# Patient Record
Sex: Female | Born: 1990 | Race: White | Hispanic: No | Marital: Single | Smoking: Never smoker
Health system: Southern US, Community
[De-identification: ages and names within clinical notes are randomized; demographics above are authoritative.]

---

## 2015-03-11 ENCOUNTER — Inpatient Hospital Stay: Payer: Medicaid Other

## 2015-03-11 ENCOUNTER — Inpatient Hospital Stay
Admission: EM | Admit: 2015-03-11 | Discharge: 2015-03-16 | DRG: 417 | Disposition: A | Discharge status: Home / Self Care | Payer: Medicaid Other | Attending: Surgery | Admitting: Surgery

## 2015-03-11 ENCOUNTER — Encounter: Payer: Self-pay | Admitting: Emergency Medicine

## 2015-03-11 ENCOUNTER — Encounter
Admission: EM | Disposition: A | Discharge status: Home / Self Care | Payer: Self-pay | Source: Home / Self Care | Attending: Surgery

## 2015-03-11 ENCOUNTER — Inpatient Hospital Stay: Payer: Medicaid Other | Admitting: Certified Registered Nurse Anesthetist

## 2015-03-11 ENCOUNTER — Emergency Department: Payer: Medicaid Other

## 2015-03-11 DIAGNOSIS — K859 Acute pancreatitis without necrosis or infection, unspecified: Secondary | ICD-10-CM

## 2015-03-11 DIAGNOSIS — R1011 Right upper quadrant pain: Secondary | ICD-10-CM

## 2015-03-11 DIAGNOSIS — K8066 Calculus of gallbladder and bile duct with acute and chronic cholecystitis without obstruction: Secondary | ICD-10-CM | POA: Diagnosis present

## 2015-03-11 DIAGNOSIS — Y848 Other medical procedures as the cause of abnormal reaction of the patient, or of later complication, without mention of misadventure at the time of the procedure: Secondary | ICD-10-CM | POA: Diagnosis not present

## 2015-03-11 DIAGNOSIS — K8050 Calculus of bile duct without cholangitis or cholecystitis without obstruction: Secondary | ICD-10-CM | POA: Diagnosis not present

## 2015-03-11 DIAGNOSIS — K851 Biliary acute pancreatitis without necrosis or infection: Secondary | ICD-10-CM | POA: Diagnosis present

## 2015-03-11 DIAGNOSIS — K819 Cholecystitis, unspecified: Secondary | ICD-10-CM

## 2015-03-11 DIAGNOSIS — K8051 Calculus of bile duct without cholangitis or cholecystitis with obstruction: Secondary | ICD-10-CM | POA: Diagnosis not present

## 2015-03-11 DIAGNOSIS — K8042 Calculus of bile duct with acute cholecystitis without obstruction: Secondary | ICD-10-CM | POA: Diagnosis present

## 2015-03-11 HISTORY — PX: ERCP: SHX5425

## 2015-03-11 LAB — COMPREHENSIVE METABOLIC PANEL
ALT: 81 U/L — ABNORMAL HIGH (ref 14–54)
AST: 128 U/L — AB (ref 15–41)
Albumin: 4 g/dL (ref 3.5–5.0)
Alkaline Phosphatase: 65 U/L (ref 38–126)
Anion gap: 9 (ref 5–15)
BUN: 11 mg/dL (ref 6–20)
CALCIUM: 9.4 mg/dL (ref 8.9–10.3)
CHLORIDE: 105 mmol/L (ref 101–111)
CO2: 26 mmol/L (ref 22–32)
CREATININE: 0.86 mg/dL (ref 0.44–1.00)
GFR calc Af Amer: >60 mL/min (ref >60)
GLUCOSE: 116 mg/dL — AB (ref 65–99)
Potassium: 3.7 mmol/L (ref 3.5–5.1)
SODIUM: 140 mmol/L (ref 135–145)
TOTAL PROTEIN: 7.4 g/dL (ref 6.5–8.1)
Total Bilirubin: 0.6 mg/dL (ref 0.3–1.2)

## 2015-03-11 LAB — CBC WITH DIFFERENTIAL/PLATELET
BASOS ABS: 0 10*3/uL (ref 0–0.1)
BASOS PCT: 0 %
Eosinophils Absolute: 0.1 10*3/uL (ref 0–0.7)
Eosinophils Relative: 1 %
HCT: 41.9 % (ref 35.0–47.0)
Hemoglobin: 14.3 g/dL (ref 12.0–16.0)
LYMPHS PCT: 18 %
Lymphs Abs: 2 10*3/uL (ref 1.0–3.6)
MCH: 31.2 pg (ref 26.0–34.0)
MCHC: 34.1 g/dL (ref 32.0–36.0)
MCV: 91.4 fL (ref 80.0–100.0)
Monocytes Absolute: 0.6 10*3/uL (ref 0.2–0.9)
Monocytes Relative: 5 %
NEUTROS ABS: 8.7 10*3/uL — AB (ref 1.4–6.5)
Neutrophils Relative %: 76 %
Platelets: 239 10*3/uL (ref 150–440)
RBC: 4.59 MIL/uL (ref 3.80–5.20)
RDW: 12.5 % (ref 11.5–14.5)
WBC: 11.4 10*3/uL — ABNORMAL HIGH (ref 3.6–11.0)

## 2015-03-11 LAB — LIPASE, BLOOD: LIPASE: 57 U/L — AB (ref 22–51)

## 2015-03-11 SURGERY — ERCP, WITH INTERVENTION IF INDICATED
Anesthesia: General

## 2015-03-11 MED ORDER — ONDANSETRON HCL 4 MG/2ML IJ SOLN
4.0000 mg | Freq: Once | INTRAMUSCULAR | Status: AC
  Administered 2015-03-11: 4 mg via INTRAVENOUS

## 2015-03-11 MED ORDER — LIDOCAINE HCL (CARDIAC) 20 MG/ML IV SOLN
INTRAVENOUS | Status: DC | PRN
  Administered 2015-03-11: 50 mg via INTRAVENOUS

## 2015-03-11 MED ORDER — HEPARIN SODIUM (PORCINE) 5000 UNIT/ML IJ SOLN
INTRAMUSCULAR | Status: AC
  Administered 2015-03-11: 5000 [IU] via SUBCUTANEOUS
  Filled 2015-03-11: qty 1

## 2015-03-11 MED ORDER — HEPARIN SODIUM (PORCINE) 5000 UNIT/ML IJ SOLN
5000.0000 [IU] | Freq: Three times a day (TID) | INTRAMUSCULAR | Status: DC
Start: 2015-03-11 — End: 2015-03-11
  Administered 2015-03-11: 5000 [IU] via SUBCUTANEOUS

## 2015-03-11 MED ORDER — PROPOFOL 10 MG/ML IV BOLUS
INTRAVENOUS | Status: DC | PRN
  Administered 2015-03-11: 50 mg via INTRAVENOUS

## 2015-03-11 MED ORDER — SODIUM CHLORIDE 0.9 % IV SOLN
3.0000 g | Freq: Four times a day (QID) | INTRAVENOUS | Status: DC
  Administered 2015-03-11 – 2015-03-16 (×21): 3 g via INTRAVENOUS
  Filled 2015-03-11 (×27): qty 3

## 2015-03-11 MED ORDER — FENTANYL CITRATE (PF) 100 MCG/2ML IJ SOLN
INTRAMUSCULAR | Status: DC | PRN
  Administered 2015-03-11 (×2): 50 ug via INTRAVENOUS

## 2015-03-11 MED ORDER — FENTANYL CITRATE (PF) 100 MCG/2ML IJ SOLN: 25.0000 ug | INTRAMUSCULAR | Status: DC | PRN

## 2015-03-11 MED ORDER — ONDANSETRON HCL 4 MG/2ML IJ SOLN
INTRAMUSCULAR | Status: AC
  Administered 2015-03-11: 4 mg via INTRAVENOUS
  Filled 2015-03-11: qty 2

## 2015-03-11 MED ORDER — KCL IN DEXTROSE-NACL 20-5-0.45 MEQ/L-%-% IV SOLN
INTRAVENOUS | Status: DC
Start: 2015-03-11 — End: 2015-03-13
  Administered 2015-03-11 – 2015-03-12 (×4): via INTRAVENOUS
  Filled 2015-03-11 (×8): qty 1000

## 2015-03-11 MED ORDER — PROPOFOL INFUSION 10 MG/ML OPTIME
INTRAVENOUS | Status: DC | PRN
  Administered 2015-03-11: 100 ug/kg/min via INTRAVENOUS

## 2015-03-11 MED ORDER — SODIUM CHLORIDE 0.9 % IV BOLUS (SEPSIS)
1000.0000 mL | Freq: Once | INTRAVENOUS | Status: DC
  Administered 2015-03-11: 1000 mL via INTRAVENOUS

## 2015-03-11 MED ORDER — HYDROMORPHONE HCL 1 MG/ML IJ SOLN
1.0000 mg | INTRAMUSCULAR | Status: DC | PRN
  Administered 2015-03-11 – 2015-03-15 (×23): 1 mg via INTRAVENOUS
  Filled 2015-03-11 (×26): qty 1

## 2015-03-11 MED ORDER — SODIUM CHLORIDE 0.9 % IV SOLN
INTRAVENOUS | Status: DC
  Administered 2015-03-11: 16:00:00 via INTRAVENOUS
  Administered 2015-03-11: 100 mL via INTRAVENOUS

## 2015-03-11 MED ORDER — ONDANSETRON HCL 4 MG PO TABS: 4.0000 mg | ORAL_TABLET | Freq: Four times a day (QID) | ORAL | Status: DC | PRN

## 2015-03-11 MED ORDER — MIDAZOLAM HCL 5 MG/5ML IJ SOLN
INTRAMUSCULAR | Status: DC | PRN
  Administered 2015-03-11: 2 mg via INTRAVENOUS

## 2015-03-11 MED ORDER — ONDANSETRON HCL 4 MG/2ML IJ SOLN: 4.0000 mg | Freq: Once | INTRAMUSCULAR | Status: AC | PRN

## 2015-03-11 MED ORDER — INDOMETHACIN 50 MG RE SUPP
100.0000 mg | RECTAL | Status: AC
  Administered 2015-03-11: 100 mg via RECTAL
  Filled 2015-03-11 (×2): qty 2

## 2015-03-11 MED ORDER — ONDANSETRON HCL 4 MG/2ML IJ SOLN
4.0000 mg | Freq: Four times a day (QID) | INTRAMUSCULAR | Status: DC | PRN
  Administered 2015-03-12 – 2015-03-13 (×4): 4 mg via INTRAVENOUS
  Filled 2015-03-11 (×5): qty 2

## 2015-03-11 MED ORDER — KCL IN DEXTROSE-NACL 20-5-0.45 MEQ/L-%-% IV SOLN
INTRAVENOUS | Status: AC
  Filled 2015-03-11: qty 1000

## 2015-03-11 MED ORDER — SODIUM CHLORIDE 0.9 % IV BOLUS (SEPSIS)
1000.0000 mL | Freq: Once | INTRAVENOUS | Status: AC
  Administered 2015-03-11: 1000 mL via INTRAVENOUS

## 2015-03-11 MED ORDER — MORPHINE SULFATE 2 MG/ML IJ SOLN: 1.0000 mg | INTRAMUSCULAR | Status: DC | PRN

## 2015-03-11 MED ORDER — MORPHINE SULFATE 2 MG/ML IJ SOLN
INTRAMUSCULAR | Status: AC
  Administered 2015-03-11: 1 mg via INTRAVENOUS
  Filled 2015-03-11: qty 1

## 2015-03-11 MED ORDER — GLYCOPYRROLATE 0.2 MG/ML IJ SOLN
INTRAMUSCULAR | Status: DC | PRN
  Administered 2015-03-11: 0.2 mg via INTRAVENOUS

## 2015-03-11 MED ORDER — MORPHINE SULFATE 2 MG/ML IJ SOLN
2.0000 mg | Freq: Once | INTRAMUSCULAR | Status: AC
  Administered 2015-03-11: 1 mg via INTRAVENOUS

## 2015-03-11 NOTE — H&P (Signed)
Samantha Lawson is an 24 y.o. female.    Chief Complaint: Right upper quadrant pain  HPI: This a patient with multiple episodes of right upper quadrant pain she is approximately 3 months postpartum she's had multiple episodes over the last 2 weeks usually they are self limiting resolving in several hours. She's been due to the hospital in the past and obtained hydrocodone which seemed to help but tonight her pain was unrelenting. She's had no fevers or chills but has noticed slightly dark urine. Her pain is in the right upper quadrant and radiates through to her back. She's had nausea and vomiting in the past as well. Denies acholic stools.  History reviewed. No pertinent past medical history.  History reviewed. No pertinent past surgical history.  History reviewed. No pertinent family history. Social History:  reports that she has never smoked. She has never used smokeless tobacco. She reports that she drinks alcohol. She reports that she does not use illicit drugs.  Allergies: No Known Allergies   (Not in a hospital admission)   Review of Systems  Constitutional: Negative for fever and chills.  HENT: Negative.   Eyes: Negative.   Respiratory: Negative for cough, hemoptysis, shortness of breath and wheezing.   Cardiovascular: Negative for chest pain, palpitations and leg swelling.  Gastrointestinal: Positive for nausea, vomiting and abdominal pain. Negative for heartburn, diarrhea, constipation, blood in stool and melena.  Genitourinary: Negative.   Musculoskeletal: Negative.   Skin: Negative.   Neurological: Negative.   Endo/Heme/Allergies: Negative.   Psychiatric/Behavioral: Negative.      Physical Exam:  BP 123/69 mmHg  Pulse 76  Temp(Src) 98.5 F (36.9 C) (Oral)  Resp 16  Ht $R'5\' 2"'Qo$  (1.575 m)  Wt 150 lb (68.04 kg)  BMI 27.43 kg/m2  SpO2 100%  LMP 02/26/2015  Physical Exam  Constitutional: She is oriented to person, place, and time and well-developed, well-nourished,  and in no distress.  HENT:  Head: Normocephalic and atraumatic.  Mouth/Throat: No oropharyngeal exudate.  Eyes: No scleral icterus.  Neck: Normal range of motion. Neck supple.  Cardiovascular: Normal rate, regular rhythm and normal heart sounds.   Pulmonary/Chest: Effort normal and breath sounds normal. No respiratory distress. She has no wheezes. She has no rales.  Abdominal: Soft. She exhibits no distension. There is tenderness. There is no rebound and no guarding.  Questionable Murphy sign  Musculoskeletal: Normal range of motion.  Lymphadenopathy:    She has no cervical adenopathy.  Neurological: She is alert and oriented to person, place, and time.  Skin: Skin is warm and dry.  No jaundice  Psychiatric: Mood, memory and affect normal.        Results for orders placed or performed during the hospital encounter of 03/11/15 (from the past 48 hour(s))  CBC with Differential     Status: Abnormal   Collection Time: 03/11/15  3:47 AM  Result Value Ref Range   WBC 11.4 (H) 3.6 - 11.0 K/uL   RBC 4.59 3.80 - 5.20 MIL/uL   Hemoglobin 14.3 12.0 - 16.0 g/dL   HCT 41.9 35.0 - 47.0 %   MCV 91.4 80.0 - 100.0 fL   MCH 31.2 26.0 - 34.0 pg   MCHC 34.1 32.0 - 36.0 g/dL   RDW 12.5 11.5 - 14.5 %   Platelets 239 150 - 440 K/uL   Neutrophils Relative % 76 %   Neutro Abs 8.7 (H) 1.4 - 6.5 K/uL   Lymphocytes Relative 18 %   Lymphs Abs 2.0 1.0 -  3.6 K/uL   Monocytes Relative 5 %   Monocytes Absolute 0.6 0.2 - 0.9 K/uL   Eosinophils Relative 1 %   Eosinophils Absolute 0.1 0 - 0.7 K/uL   Basophils Relative 0 %   Basophils Absolute 0.0 0 - 0.1 K/uL  Comprehensive metabolic panel     Status: Abnormal   Collection Time: 03/11/15  3:47 AM  Result Value Ref Range   Sodium 140 135 - 145 mmol/L   Potassium 3.7 3.5 - 5.1 mmol/L   Chloride 105 101 - 111 mmol/L   CO2 26 22 - 32 mmol/L   Glucose, Bld 116 (H) 65 - 99 mg/dL   BUN 11 6 - 20 mg/dL   Creatinine, Ser 0.86 0.44 - 1.00 mg/dL   Calcium  9.4 8.9 - 10.3 mg/dL   Total Protein 7.4 6.5 - 8.1 g/dL   Albumin 4.0 3.5 - 5.0 g/dL   AST 128 (H) 15 - 41 U/L   ALT 81 (H) 14 - 54 U/L   Alkaline Phosphatase 65 38 - 126 U/L   Total Bilirubin 0.6 0.3 - 1.2 mg/dL   GFR calc non Af Amer >60 >60 mL/min   GFR calc Af Amer >60 >60 mL/min    Comment: (NOTE) The eGFR has been calculated using the CKD EPI equation. This calculation has not been validated in all clinical situations. eGFR's persistently <60 mL/min signify possible Chronic Kidney Disease.    Anion gap 9 5 - 15  Lipase, blood     Status: Abnormal   Collection Time: 03/11/15  3:47 AM  Result Value Ref Range   Lipase 57 (H) 22 - 51 U/L   US Abdomen Limited  03/11/2015   CLINICAL DATA:  Right upper quadrant pain for 2 hours. Similar symptoms intermittently for the past 3 months.  EXAM: US ABDOMEN LIMITED - RIGHT UPPER QUADRANT  COMPARISON:  None.  FINDINGS: Gallbladder:  Multiple calculi are present within the gallbladder lumen. There is mild gallbladder mural thickening, 3.8 mm. The patient was tender over the gallbladder.  Common bile duct:  Diameter: 7 mm, mildly dilated.  Liver:  No focal lesion identified. Within normal limits in parenchymal echogenicity.  IMPRESSION: Cholelithiasis. Mild gallbladder mural thickening. Tenderness over the gallbladder. The findings are concerning for acute cholecystitis. Mild dilatation of the extrahepatic bile ducts raises the question of choledocholithiasis.   Electronically Signed   By: Andreas Newport M.D.   On: 03/11/2015 03:19     Assessment/Plan  This is a patient with right upper quadrant pain with gallstones on ultrasound and suggestive of acute cholecystitis. She also has elevated liver function tests suggesting choledocholithiasis. My plan would be to admit her to the hospital hydrated control nausea and pain start anabiotic's and repeat liver function tests. At some point in the next 24 hours as liver function tests improve then  laparoscopic cholecystectomy could be performed. The rationale for this approach was discussed with she and her husband the options of observation were reviewed and she agrees with this plan. Discussed with Dr. Beather Arbour and nursing.  Florene Glen, MD, FACS

## 2015-03-11 NOTE — ED Provider Notes (Signed)
[facility] Emergency Department Provider Note  ____________________________________________  Time seen: Approximately 3:32 AM  I have reviewed the triage vital signs and the nursing notes.   HISTORY  Chief Complaint Abdominal Pain    HPI Samantha Lawson is a 24 y.o. female who presents with a 2 week history of RUQ pain. Patient is 3 months post-partum, not breastfeeding. She had a CT scan by her PCP last week and she was told she needed an [place]. Patient did not follow up with [place]. Patient reports 7/10 sharp, stabbing RUQ pain radiating into her back. Vomited once tonight secondary to pain. Denies fever, chills, diarrhea, headache, numbness, tingling. Nothing makes the pain better or worse.   History reviewed. No pertinent past medical history.  There are no active problems to display for this patient.   History reviewed. No pertinent past surgical history.  No current outpatient prescriptions on file.  Allergies Review of patient's allergies indicates no known allergies.  History reviewed. No pertinent family history.  Social History History  Substance Use Topics  . Smoking status: Never Smoker   . Smokeless tobacco: Never Used  . Alcohol Use: Yes    Review of Systems Constitutional: No fever/chills Eyes: No visual changes. ENT: No sore throat. Cardiovascular: Denies chest pain. Respiratory: Denies shortness of breath. Gastrointestinal: Positive for abdominal pain.  No nausea, no vomiting.  No diarrhea.  No constipation. Genitourinary: Negative for dysuria. Musculoskeletal: Negative for back pain. Skin: Negative for rash. Neurological: Negative for headaches, focal weakness or numbness.  10-point ROS otherwise negative.  ____________________________________________   PHYSICAL EXAM:  VITAL SIGNS: ED Triage Vitals  Enc Vitals Group     BP 03/11/15 0229 140/87 mmHg     Pulse Rate 03/11/15 0229 106     Resp 03/11/15 0229 18     Temp  03/11/15 0229 98.5 F (36.9 C)     Temp Source 03/11/15 0229 Oral     SpO2 03/11/15 0229 98 %     Weight 03/11/15 0229 150 lb (68.04 kg)     Height 03/11/15 0229  (1.575 m)     Head Cir --      Peak Flow --      Pain Score 03/11/15 0229 9     Pain Loc --      Pain Edu? --      Excl. in GC? --     Constitutional: Alert and oriented. Well appearing and in no acute distress. Eyes: Conjunctivae are normal. PERRL. EOMI. Head: Atraumatic. Nose: No congestion/rhinnorhea. Mouth/Throat: Mucous membranes are moist.  Oropharynx non-erythematous. Neck: No stridor.   Cardiovascular: Normal rate, regular rhythm. Grossly normal heart sounds.  Good peripheral circulation. Respiratory: Normal respiratory effort.  No retractions. Lungs CTAB. Gastrointestinal: Soft, mildly tender to palpation epigastrium and right upper quadrant without rebound or guarding.. No distention. No abdominal bruits. No CVA tenderness. Musculoskeletal: No lower extremity tenderness nor edema.  No joint effusions. Neurologic:  Normal speech and language. No gross focal neurologic deficits are appreciated. Speech is normal. No gait instability. Skin:  Skin is warm, dry and intact. No rash noted. Psychiatric: Mood and affect are normal. Speech and behavior are normal.  ____________________________________________   LABS (all labs ordered are listed, but only abnormal results are displayed)  Labs Reviewed  CBC WITH DIFFERENTIAL/PLATELET - Abnormal; Notable for the following:    WBC 11.4 (*)    Neutro Abs 8.7 (*)    All other components within normal limits  COMPREHENSIVE METABOLIC  PANEL - Abnormal; Notable for the following:    Glucose, Bld 116 (*)    AST 128 (*)    ALT 81 (*)    All other components within normal limits  LIPASE, BLOOD - Abnormal; Notable for the following:    Lipase 57 (*)    All other components within normal limits    ____________________________________________  EKG  None ____________________________________________  RADIOLOGY  Ultrasound abdomen limited interpreted per Dr. Clovis RileyMitchell: Cholelithiasis. Mild gallbladder mural thickening. Tenderness over the gallbladder. The findings are concerning for acute cholecystitis. Mild dilatation of the extrahepatic bile ducts raises the question of choledocholithiasis. ____________________________________________   PROCEDURES  Procedure(s) performed: None  Critical Care performed: No  ____________________________________________   INITIAL IMPRESSION / ASSESSMENT AND PLAN / ED COURSE  Pertinent labs & imaging results that were available during my care of the patient were reviewed by me and considered in my medical decision making (see chart for details).  24 year old female with a two-week history of right upper quadrant pain. Will obtain basic labs including liver function enzymes and lipase, plan for IV analgesia; obtain ultrasound to evaluate for cholecystitis.  ----------------------------------------- 4:44 AM on 03/11/2015 -----------------------------------------  Discussed case with Dr. Excell Seltzerooper who will evaluate patient in the emergency department. ____________________________________________   FINAL CLINICAL IMPRESSION(S) / ED DIAGNOSES  Final diagnoses:  Right upper quadrant pain  Cholecystitis      Irean HongJade J Terren Jandreau, MD 03/11/15 [phone]

## 2015-03-11 NOTE — ED Notes (Signed)
Dr. Cooper at bedside 

## 2015-03-11 NOTE — Consult Note (Signed)
Gastroenterology Consultation  Referring Provider:  Dr Egbert Garibaldi Primary Care Physician:  No PCP Per Patient Primary Gastroenterologist:  Dr. Servando Snare (new)         Reason for Consultation:     choledocholithiasis  Date of Admission:  03/11/2015 Date of Consultation:  03/11/2015        HPI:   Samantha Lawson is a 24 y.o. female admitted with choledocholithiasis.  Since began to have epigastric pain about 2 weeks ago. She was seen in emergency department. She was told to follow-up with an ultrasound however she felt much better. More recently she developed several hours of severe epigastric pain, nausea and vomiting. Pain radiated through to her back. She presented to hospital. She was found to have choledocholithiasis on MRCP. We will consult for ERCP with stone extraction and sphincterotomy. She denies any fever or chills. Denies any jaundice or pruritus. She is about 3 months postpartum.    History reviewed. No pertinent past medical history.  History reviewed. No pertinent past surgical history.  Prior to Admission medications   Not on File    Family History  Problem Relation Age of Onset  . Colon cancer Neg Hx   . Liver disease       History   Social History Narrative   Single, lives with her boyfriend, 20-month-old daughter, stay-at-home mom   History  Substance Use Topics  . Smoking status: Never Smoker   . Smokeless tobacco: Never Used  . Alcohol Use: 0.0 oz/week    0 Standard drinks or equivalent per week     Comment: Couple drinks per month    Allergies as of 03/11/2015  . (No Known Allergies)    Review of Systems:    All systems reviewed and negative except where noted in HPI.   Physical Exam:  Vital signs in last 24 hours: Temp:  [98.4 F (36.9 C)-98.5 F (36.9 C)] 98.4 F (36.9 C) (07/01 0619) Pulse Rate:  [60-106] 64 (07/01 0619) Resp:  [16-18] 16 (07/01 0619) BP: (110-140)/(60-87) 124/73 mmHg (07/01 0619) SpO2:  [95 %-100 %] 100 % (07/01 0619) Weight:  [144 lb  11.2 oz (65.635 kg)-150 lb (68.04 kg)] 144 lb 11.2 oz (65.635 kg) (07/01 0623)  Body mass index is 26.46 kg/(m^2). General:   Alert,  Well-developed, well-nourished, pleasant and cooperative in NAD, significant other is at the bedside with her infant daughter Head:  Normocephalic and atraumatic. Eyes:  Sclera clear, no icterus.   Conjunctiva pink. Ears:  Normal auditory acuity. Nose:  No deformity, discharge, or lesions. Mouth:  No deformity or lesions,oropharynx pink & moist. Neck:  Supple; no masses or thyromegaly. Lungs:  Respirations even and unlabored.  Clear throughout to auscultation.   No wheezes, crackles, or rhonchi. No acute distress. Heart:  Regular rate and rhythm; no murmurs, clicks, rubs, or gallops. Abdomen:  Normal bowel sounds.  No bruits.  Soft, non-distended without masses, hepatosplenomegaly or hernias noted.  + Murphy's point tenderness. No guarding or rebound tenderness.   Rectal:  Deferred. Msk:  Symmetrical without gross deformities.  Good, equal movement & strength bilaterally. Pulses:  Normal pulses noted. Extremities:  No clubbing or edema.  No cyanosis. Neurologic:  Alert and oriented x3;  grossly normal neurologically. Skin:  Intact without significant lesions or rashes.  No jaundice. Lymph Nodes:  No significant cervical adenopathy. Psych:  Alert and cooperative. Normal mood and affect.  LAB RESULTS:  Recent Labs  03/11/15 0347  WBC 11.4*  HGB 14.3  HCT 41.9  PLT 239   BMET  Recent Labs  03/11/15 0347  NA 140  K 3.7  CL 105  CO2 26  GLUCOSE 116*  BUN 11  CREATININE 0.86  CALCIUM 9.4   LFT  Recent Labs  03/11/15 0347  PROT 7.4  ALBUMIN 4.0  AST 128*  ALT 81*  ALKPHOS 65  BILITOT 0.6  LIPASE 57*   STUDIES: Mr Abdomen Mrcp Wo Cm  03/11/2015   CLINICAL DATA:  Choledocholithiasis. Right upper quadrant abdominal pain for 2 weeks. The patient is 3 months postpartum.  EXAM: MRI ABDOMEN WITHOUT CONTRAST  (INCLUDING MRCP)  TECHNIQUE:  Multiplanar multisequence MR imaging of the abdomen was performed. Heavily T2-weighted images of the biliary and pancreatic ducts were obtained, and three-dimensional MRCP images were rendered by post processing.  COMPARISON:  03/11/2015  FINDINGS: Lower chest:  Unremarkable  Hepatobiliary: Numerous gallstones are layering in the gallbladder, with gallbladder wall thickening and trace pericholecystic fluid.  Mild intrahepatic biliary dilatation. Common hepatic duct 9 mm diameter, common bile duct 8 mm diameter. There multiple small filling defects in the distal CBD ranging from about 1 mm to 2.5 mm in diameter, as shown on images 12 through 14 of series 13 ; image 7 of series 15, compatible with choledocholithiasis.  Pancreas: Unremarkable  Spleen: Unremarkable  Adrenals/Urinary Tract: Unremarkable  Stomach/Bowel: Unremarkable  Vascular/Lymphatic: Unremarkable  Other: No supplemental non-categorized findings.  Musculoskeletal: Unremarkable  IMPRESSION: 1. Numerous layering tiny gallstones in the gallbladder, with multiple gallstones in the distal common bile duct measuring between about 1 and 2.5 mm in diameter. There is moderate extrahepatic biliary dilatation and mild intrahepatic biliary dilatation, and an impacted stone in the vicinity of the ampulla of is a likely contributor. 2. Gallbladder wall thickening with trace pericholecystic fluid. Acute cholecystitis is not excluded.   Electronically Signed   By: Gaylyn RongWalter  Liebkemann M.D.   On: 03/11/2015 10:22   [place] Abdomen Limited  03/11/2015   CLINICAL DATA:  Right upper quadrant pain for 2 hours. Similar symptoms intermittently for the past 3 months.  EXAM: US ABDOMEN LIMITED - RIGHT UPPER QUADRANT  COMPARISON:  None.  FINDINGS: Gallbladder:  Multiple calculi are present within the gallbladder lumen. There is mild gallbladder mural thickening, 3.8 mm. The patient was tender over the gallbladder.  Common bile duct:  Diameter: 7 mm, mildly dilated.  Liver:  No focal  lesion identified. Within normal limits in parenchymal echogenicity.  IMPRESSION: Cholelithiasis. Mild gallbladder mural thickening. Tenderness over the gallbladder. The findings are concerning for acute cholecystitis. Mild dilatation of the extrahepatic bile ducts raises the question of choledocholithiasis.   Electronically Signed   By: Ellery Plunkaniel R Mitchell M.D.   On: 03/11/2015 03:19     Impression / Plan:   Samantha Lawson is a 24 y.o. y/o female with choledocholithiasis and will undergo ERCP with stone extraction and sphincterotomy with Dr. Servando SnareWohl today.  I have discussed risks & benefits which include, but are not limited to, pancreatitis, bleeding, infection, perforation & drug reaction.  The patient agrees with this plan & written consent will be obtained.   #1 heparin on hold #2 indomethacin 100 mg per rectum on call to procedure #3 ERCP today #4 continue supportive measures Thank you for involving me in the care of this patient.  The care of Samantha Lawson will be discussed in direct collaboration with Dr Midge Miniumarren Wohl, Attending Gastroenterologist.   LOS: 0 days  Lorenza BurtonKandice Anguel Delapena, NP  03/11/2015, 12:34 PM [facility]  [address]  [address] [place], [place] [address] Phone: [phone] Fax : [phone]

## 2015-03-11 NOTE — Op Note (Signed)
[facility] Gastroenterology Patient Name: Samantha Lawson Procedure Date: 03/11/2015 4:15 PM MRN: [id number] Account #: [id number] Date of Birth: 02-Feb-1991 Admit Type: Inpatient Age: 24 Room: [facility] ENDO ROOM 4 Gender: Female Note Status: Finalized Procedure:         ERCP Indications:       Bile duct stone(s) Providers:         Samantha Minium, MD Referring MD:      Loura Back (Referring MD) Medicines:         Propofol per Anesthesia Complications:     No immediate complications. Procedure:         Pre-Anesthesia Assessment:                    - Prior to the procedure, a History and Physical was                     performed, and patient medications and allergies were                     reviewed. The patient's tolerance of previous anesthesia                     was also reviewed. The risks and benefits of the procedure                     and the sedation options and risks were discussed with the                     patient. All questions were answered, and informed consent                     was obtained. Prior Anticoagulants: The patient has taken                     no previous anticoagulant or antiplatelet agents. ASA                     Grade Assessment: II - A patient with mild systemic                     disease. After reviewing the risks and benefits, the                     patient was deemed in satisfactory condition to undergo                     the procedure.                    After obtaining informed consent, the scope was passed                     under direct vision. Throughout the procedure, the                     patient's blood pressure, pulse, and oxygen saturations                     were monitored continuously. The Enteroscope was                     introduced through the mouth, and used to inject contrast  into and used to inject contrast into the bile duct and                     ventral pancreatic duct. The ERCP was  technically                     difficult and complex due to challenging cannulation. The                     patient tolerated the procedure well. Findings:      The major papilla was normal. The ventral pancreatic duct was deeply       cannulated with the short-nosed traction sphincterotome. Contrast was       injected. I personally interpreted the bile duct and pancreatic duct       images. There was brisk flow of contrast through the ducts. Image       quality was excellent. A wire was placed in the PD and left during the       procedure. A wire was then passed into the biliary tree. The bile duct       was deeply cannulated. Contrast was injected. The lower third of the       main bile duct contained filling defect(s) thought to be a stone.       Biliary sphincterotomy was made with a sphincterotome using ERBE       electrocautery. There was no post-sphincterotomy bleeding. The biliary       tree was swept with a 15 mm balloon starting at the bifurcation. Many       stones were removed. No stones remained. Impression:        - The major papilla appeared normal.                    - Normal Pancreaatic duct.                    - A filling defect consistent with a stone was seen on the                     cholangiogram.                    - A sphincterotomy was performed.                    - The biliary tree was swept.                    - Choledocholithiasis was found. Complete removal was                     accomplished by biliary sphincterotomy and balloon                     extraction.                    - No specimens collected. Recommendation:    - Watch for pancreatitis, bleeding, perforation, and                     cholangitis. Procedure Code(s): --- Professional ---                    [phone], Endoscopic retrograde cholangiopancreatography                     (  ERCP); with removal of calculi/debris from                     biliary/pancreatic duct(s)                     43262, Endoscopic retrograde cholangiopancreatography                     (ERCP); with sphincterotomy/papillotomy                    [id number], Combined endoscopic catheterization of the biliary                     and pancreatic ductal systems, radiological supervision                     and interpretation CPT copyright 2014 American Medical Association. All rights reserved. The codes documented in this report are preliminary and upon coder review may  be revised to meet current compliance requirements. Samantha Miniumarren Cheridan Kibler, MD 03/11/2015 5:43:18 PM This report has been signed electronically. Number of Addenda: 0 Note Initiated On: 03/11/2015 4:15 PM      [facility]

## 2015-03-11 NOTE — Progress Notes (Signed)
Present at ERCP. Multiple common bile duct stones were removed. ERCP was difficult due to angulation of common bile duct. Discussed this at length with Dr. Servando SnareWohl.  Plan is for laparoscopic cholecystectomy if no postoperative ERCP pancreatitis is noted in the morning.

## 2015-03-11 NOTE — Transfer of Care (Signed)
Immediate Anesthesia Transfer of Care Note  Patient: Samantha Lawson  Procedure(s) Performed: Procedure(s): ENDOSCOPIC RETROGRADE CHOLANGIOPANCREATOGRAPHY (ERCP) (N/A)  Patient Location: PACU  Anesthesia Type:General  Level of Consciousness: awake and patient cooperative  Airway & Oxygen Therapy: Patient Spontanous Breathing and Patient connected to nasal cannula oxygen  Post-op Assessment: Report given to RN and Post -op Vital signs reviewed and stable  Post vital signs: Reviewed and stable  Last Vitals:  Filed Vitals:   03/11/15 1737  BP: 140/109  Pulse: 138  Temp: 36.1 C  Resp: 18    Complications: No apparent anesthesia complications

## 2015-03-11 NOTE — Anesthesia Postprocedure Evaluation (Signed)
  Anesthesia Post-op Note  Patient: Samantha Lawson  Procedure(s) Performed: Procedure(s): ENDOSCOPIC RETROGRADE CHOLANGIOPANCREATOGRAPHY (ERCP) (N/A)  Anesthesia type:General  Patient location: PACU  Post pain: Pain level controlled  Post assessment: Post-op Vital signs reviewed, Patient's Cardiovascular Status Stable, Respiratory Function Stable, Patent Airway and No signs of Nausea or vomiting  Post vital signs: Reviewed and stable  Last Vitals:  Filed Vitals:   03/11/15 1947  BP: 116/83  Pulse: 69  Temp: 36.5 C  Resp: 16    Level of consciousness: awake, alert  and patient cooperative  Complications: No apparent anesthesia complications

## 2015-03-11 NOTE — Progress Notes (Signed)
Spoke with Dr. Lemar LivingsByrnett - pt requesting heating pad for abdomen - ok to order and apply PRN

## 2015-03-11 NOTE — Anesthesia Preprocedure Evaluation (Signed)
Anesthesia Evaluation  Patient identified by MRN, date of birth, ID band Patient awake    Reviewed: Allergy & Precautions, NPO status , Patient's Chart, lab work & pertinent test results, reviewed documented beta blocker date and time   Airway Mallampati: II  TM Distance: >3 FB     Dental  (+) Chipped   Pulmonary          Cardiovascular     Neuro/Psych    GI/Hepatic   Endo/Other    Renal/GU      Musculoskeletal   Abdominal   Peds  Hematology   Anesthesia Other Findings   Reproductive/Obstetrics                             Anesthesia Physical Anesthesia Plan  ASA: II  Anesthesia Plan: General   Post-op Pain Management:    Induction: Intravenous  Airway Management Planned: Oral ETT and Nasal Cannula  Additional Equipment:   Intra-op Plan:   Post-operative Plan:   Informed Consent: I have reviewed the patients History and Physical, chart, labs and discussed the procedure including the risks, benefits and alternatives for the proposed anesthesia with the patient or authorized representative who has indicated his/her understanding and acceptance.     Plan Discussed with: CRNA  Anesthesia Plan Comments:         Anesthesia Quick Evaluation

## 2015-03-11 NOTE — ED Notes (Addendum)
Pt reports RUQ pain x 2 weeks intermittently, had a CT done 2 weeks ago and told she needs US.  Pt reports pain is stabbing.  Pt reports pain most often occurs when she is sleeping.  Pt 48mo post partum. Pt NAD at this time.

## 2015-03-11 NOTE — ED Notes (Signed)
Pt assisted to bathroom

## 2015-03-11 NOTE — Progress Notes (Signed)
[facility]   PATIENT NAME: Samantha LankHolly Lawson    MR#:  [id number]  DATE OF BIRTH:  May 18, 1991  SUBJECTIVE:  She is still having continued epigastric and right upper quadrant abdominal pain. She's had no nausea or vomiting.  REVIEW OF SYSTEMS:   Review of Systems  Constitutional: Positive for weight loss. Negative for fever and chills.  Respiratory: Negative for cough and hemoptysis.   Cardiovascular: Negative for chest pain and palpitations.  Gastrointestinal: Positive for abdominal pain. Negative for heartburn, nausea and vomiting.  Genitourinary: Negative for dysuria.  All other systems reviewed and are negative.   DRUG ALLERGIES:  No Known Allergies  VITALS:  Blood pressure 124/73, pulse 64, temperature 98.4 F (36.9 C), temperature source Oral, resp. rate 16, height 5\' 2"  (1.575 m), weight 65.635 kg (144 lb 11.2 oz), last menstrual period 02/26/2015, SpO2 100 %.  PHYSICAL EXAMINATION:  GENERAL:  24 y.o.-year-old patient lying in the bed with no acute distress.  EYES: Pupils equal, round, reactive to light and accommodation. No scleral icterus. Extraocular muscles intact.  HEENT: Head atraumatic, normocephalic. Oropharynx and nasopharynx clear.  NECK:  Supple, no jugular venous distention. No thyroid enlargement, no tenderness.  LUNGS: Normal breath sounds bilaterally, no wheezing, rales,rhonchi or crepitation. No use of accessory muscles of respiration.  CARDIOVASCULAR: S1, S2 normal. No murmurs, rubs, or gallops.  ABDOMEN: Soft, tender in the right upper quadrant and epigastric region, the abdomen is nondistended. No organomegaly or mass.  EXTREMITIES: No pedal edema, cyanosis, or clubbing.  NEUROLOGIC: Cranial nerves II through XII are intact. Muscle strength 5/5 in all extremities. Sensation intact. Gait not checked.  PSYCHIATRIC: The patient is alert and oriented x 3.  SKIN: No obvious rash, lesion, or ulcer.     ASSESSMENT AND PLAN:   24 year old female  recently postpartum with symptomatic cholelithiasis history acute on chronic calculus cholecystitis and possible choledocholithiasis with elevated liver function tests. CBD 7 mm on ultrasound. Plan for MRCP today. If negative laparoscopic cholecystectomy, if positive proceed with ERCP.

## 2015-03-11 NOTE — ED Notes (Signed)
Pt in with co ruq pain x 2-3 weeks saw pmd had normal ct scan and told to have US of gallbladder.

## 2015-03-12 ENCOUNTER — Encounter
Admission: EM | Disposition: A | Discharge status: Home / Self Care | Payer: Self-pay | Source: Home / Self Care | Attending: Surgery

## 2015-03-12 ENCOUNTER — Encounter: Payer: Self-pay | Admitting: Anesthesiology

## 2015-03-12 DIAGNOSIS — K8050 Calculus of bile duct without cholangitis or cholecystitis without obstruction: Secondary | ICD-10-CM | POA: Diagnosis present

## 2015-03-12 DIAGNOSIS — K859 Acute pancreatitis without necrosis or infection, unspecified: Secondary | ICD-10-CM

## 2015-03-12 LAB — CBC
HEMATOCRIT: 43 % (ref 35.0–47.0)
Hemoglobin: 14.3 g/dL (ref 12.0–16.0)
MCH: 31 pg (ref 26.0–34.0)
MCHC: 33.3 g/dL (ref 32.0–36.0)
MCV: 93.2 fL (ref 80.0–100.0)
Platelets: 221 10*3/uL (ref 150–440)
RBC: 4.61 MIL/uL (ref 3.80–5.20)
RDW: 12.7 % (ref 11.5–14.5)
WBC: 13.2 10*3/uL — ABNORMAL HIGH (ref 3.6–11.0)

## 2015-03-12 LAB — COMPREHENSIVE METABOLIC PANEL
ALT: 326 U/L — AB (ref 14–54)
AST: 271 U/L — AB (ref 15–41)
Albumin: 3.6 g/dL (ref 3.5–5.0)
Alkaline Phosphatase: 96 U/L (ref 38–126)
Anion gap: 4 — ABNORMAL LOW (ref 5–15)
BUN: 8 mg/dL (ref 6–20)
CALCIUM: 8.5 mg/dL — AB (ref 8.9–10.3)
CO2: 26 mmol/L (ref 22–32)
Chloride: 108 mmol/L (ref 101–111)
Creatinine, Ser: 0.62 mg/dL (ref 0.44–1.00)
GLUCOSE: 144 mg/dL — AB (ref 65–99)
Potassium: 4.1 mmol/L (ref 3.5–5.1)
Sodium: 138 mmol/L (ref 135–145)
Total Bilirubin: 1.8 mg/dL — ABNORMAL HIGH (ref 0.3–1.2)
Total Protein: 6.7 g/dL (ref 6.5–8.1)

## 2015-03-12 LAB — MRSA PCR SCREENING: MRSA by PCR: NEGATIVE

## 2015-03-12 LAB — LIPASE, BLOOD: Lipase: 2433 U/L — ABNORMAL HIGH (ref 22–51)

## 2015-03-12 LAB — PREGNANCY, URINE: Preg Test, Ur: NEGATIVE

## 2015-03-12 SURGERY — LAPAROSCOPIC CHOLECYSTECTOMY
Anesthesia: General

## 2015-03-12 MED ORDER — DIPHENHYDRAMINE HCL 50 MG/ML IJ SOLN
25.0000 mg | Freq: Four times a day (QID) | INTRAMUSCULAR | Status: DC | PRN
  Administered 2015-03-12: 25 mg via INTRAVENOUS
  Filled 2015-03-12: qty 1

## 2015-03-12 MED ORDER — ENOXAPARIN SODIUM 40 MG/0.4ML ~~LOC~~ SOLN
40.0000 mg | SUBCUTANEOUS | Status: DC
  Administered 2015-03-12 – 2015-03-14 (×2): 40 mg via SUBCUTANEOUS
  Filled 2015-03-12 (×3): qty 0.4

## 2015-03-12 NOTE — Consult Note (Signed)
Subjective: Patient seen for choledocholithiasis, ERCP yesterday, post ERCP pancreatitis. She is feeling some better than this morning although she still has abdominal pain in the left upper quadrant that is difficult enough to require pain medication. He has had no nausea or vomiting. She is burping a lot but is not passing flatus.  Objective: Vital signs in last 24 hours: Temp:  [96.9 F (36.1 C)-98.3 F (36.8 C)] 98.3 F (36.8 C) (07/02 1545) Pulse Rate:  [69-145] 98 (07/02 1545) Resp:  [9-20] 16 (07/02 1545) BP: (116-140)/(71-109) 125/84 mmHg (07/02 1545) SpO2:  [97 %-100 %] 100 % (07/02 1545) Blood pressure 125/84, pulse 98, temperature 98.3 F (36.8 C), temperature source Oral, resp. rate 16, height  (1.575 m), weight 65.635 kg (144 lb 11.2 oz), last menstrual period 02/26/2015, SpO2 100 %.   Intake/Output from previous day: 07/01 0701 - 07/02 0700 In: 1097 [I.V.:1097] Out: 1350 [Urine:1350]  Intake/Output this shift: Total I/O In: 1400.6 [I.V.:1321.4; IV Piggyback:79.1] Out: 750 [Urine:750]   General appearance:  Well-appearing 24 year old female no acute distress Resp:  Clear to auscultation Cardio:  Regular rate and rhythm without rub or gallop GI:  Soft positive tender to palpation toward the left upper quadrant. No masses rebound or organomegaly. Extremities:  No clubbing cyanosis or edema   Lab Results: Results for orders placed or performed during the hospital encounter of 03/11/15 (from the past 24 hour(s))  Comprehensive metabolic panel     Status: Abnormal   Collection Time: 03/12/15  4:16 AM  Result Value Ref Range   Sodium 138 135 - 145 mmol/L   Potassium 4.1 3.5 - 5.1 mmol/L   Chloride 108 101 - 111 mmol/L   CO2 26 22 - 32 mmol/L   Glucose, Bld 144 (H) 65 - 99 mg/dL   BUN 8 6 - 20 mg/dL   Creatinine, Ser 1.61 0.44 - 1.00 mg/dL   Calcium 8.5 (L) 8.9 - 10.3 mg/dL   Total Protein 6.7 6.5 - 8.1 g/dL   Albumin 3.6 3.5 - 5.0 g/dL   AST 096 (H) 15 -  41 U/L   ALT 326 (H) 14 - 54 U/L   Alkaline Phosphatase 96 38 - 126 U/L   Total Bilirubin 1.8 (H) 0.3 - 1.2 mg/dL   GFR calc non Af Amer >60 >60 mL/min   GFR calc Af Amer >60 >60 mL/min   Anion gap 4 (L) 5 - 15  CBC     Status: Abnormal   Collection Time: 03/12/15  4:16 AM  Result Value Ref Range   WBC 13.2 (H) 3.6 - 11.0 K/uL   RBC 4.61 3.80 - 5.20 MIL/uL   Hemoglobin 14.3 12.0 - 16.0 g/dL   HCT 04.5 40.9 - 81.1 %   MCV 93.2 80.0 - 100.0 fL   MCH 31.0 26.0 - 34.0 pg   MCHC 33.3 32.0 - 36.0 g/dL   RDW 91.4 78.2 - 95.6 %   Platelets 221 150 - 440 K/uL  Lipase, blood     Status: Abnormal   Collection Time: 03/12/15  4:16 AM  Result Value Ref Range   Lipase 2433 (H) 22 - 51 U/L  MRSA PCR Screening     Status: None   Collection Time: 03/12/15  6:00 AM  Result Value Ref Range   MRSA by PCR NEGATIVE NEGATIVE      Recent Labs  03/11/15 0347 03/12/15 0416  WBC 11.4* 13.2*  HGB 14.3 14.3  HCT 41.9 43.0  PLT 239 221  BMET  Recent Labs  03/11/15 0347 03/12/15 0416  NA 140 138  K 3.7 4.1  CL 105 108  CO2 26 26  GLUCOSE 116* 144*  BUN 11 8  CREATININE 0.86 0.62  CALCIUM 9.4 8.5*   LFT  Recent Labs  03/12/15 0416  PROT 6.7  ALBUMIN 3.6  AST 271*  ALT 326*  ALKPHOS 96  BILITOT 1.8*   PT/INR No results for input(s): LABPROT, INR in the last 72 hours. Hepatitis Panel No results for input(s): HEPBSAG, HCVAB, HEPAIGM, HEPBIGM in the last 72 hours. C-Diff No results for input(s): CDIFFTOX in the last 72 hours. No results for input(s): CDIFFPCR in the last 72 hours.   Studies/Results: Mr Abdomen Mrcp Wo Cm  03/11/2015   CLINICAL DATA:  Choledocholithiasis. Right upper quadrant abdominal pain for 2 weeks. The patient is 3 months postpartum.  EXAM: MRI ABDOMEN WITHOUT CONTRAST  (INCLUDING MRCP)  TECHNIQUE: Multiplanar multisequence MR imaging of the abdomen was performed. Heavily T2-weighted images of the biliary and pancreatic ducts were obtained, and  three-dimensional MRCP images were rendered by post processing.  COMPARISON:  03/11/2015  FINDINGS: Lower chest:  Unremarkable  Hepatobiliary: Numerous gallstones are layering in the gallbladder, with gallbladder wall thickening and trace pericholecystic fluid.  Mild intrahepatic biliary dilatation. Common hepatic duct 9 mm diameter, common bile duct 8 mm diameter. There multiple small filling defects in the distal CBD ranging from about 1 mm to 2.5 mm in diameter, as shown on images 12 through 14 of series 13 ; image 7 of series 15, compatible with choledocholithiasis.  Pancreas: Unremarkable  Spleen: Unremarkable  Adrenals/Urinary Tract: Unremarkable  Stomach/Bowel: Unremarkable  Vascular/Lymphatic: Unremarkable  Other: No supplemental non-categorized findings.  Musculoskeletal: Unremarkable  IMPRESSION: 1. Numerous layering tiny gallstones in the gallbladder, with multiple gallstones in the distal common bile duct measuring between about 1 and 2.5 mm in diameter. There is moderate extrahepatic biliary dilatation and mild intrahepatic biliary dilatation, and an impacted stone in the vicinity of the ampulla of is a likely contributor. 2. Gallbladder wall thickening with trace pericholecystic fluid. Acute cholecystitis is not excluded.   Electronically Signed   By: Gaylyn RongWalter  Liebkemann M.D.   On: 03/11/2015 10:22   [place] Abdomen Limited  03/11/2015   CLINICAL DATA:  Right upper quadrant pain for 2 hours. Similar symptoms intermittently for the past 3 months.  EXAM: US ABDOMEN LIMITED - RIGHT UPPER QUADRANT  COMPARISON:  None.  FINDINGS: Gallbladder:  Multiple calculi are present within the gallbladder lumen. There is mild gallbladder mural thickening, 3.8 mm. The patient was tender over the gallbladder.  Common bile duct:  Diameter: 7 mm, mildly dilated.  Liver:  No focal lesion identified. Within normal limits in parenchymal echogenicity.  IMPRESSION: Cholelithiasis. Mild gallbladder mural thickening. Tenderness over  the gallbladder. The findings are concerning for acute cholecystitis. Mild dilatation of the extrahepatic bile ducts raises the question of choledocholithiasis.   Electronically Signed   By: Ellery Plunkaniel R Mitchell M.D.   On: 03/11/2015 03:19    Scheduled Inpatient Medications:   . ampicillin-sulbactam (UNASYN) IV  3 g Intravenous Q6H    Continuous Inpatient Infusions:   . dextrose 5 % and 0.45 % NaCl with KCl 20 mEq/L 125 mL/hr at 03/12/15 0052    PRN Inpatient Medications:  HYDROmorphone (DILAUDID) injection, ondansetron **OR** ondansetron (ZOFRAN) IV  Miscellaneous:   Assessment:  1. He does cholelithiasis, status post ERCP withbile duct clearance. Patient with post-ERCP pancreatitis. Feeling some better than this morning. Repeat  lipase pending. 2. Cholecystitis/cholelithiasis  Plan:  1. Continue current following a lipase and clinically for her pancreatitis. Cholecystectomy when clinically feasible. Discussed with Dr. Colleen Can MD 03/12/2015, 5:33 PM

## 2015-03-12 NOTE — Progress Notes (Signed)
[facility]   PATIENT NAME: Samantha Lawson    MR#:  [id number]  DATE OF BIRTH:  10-07-1990  SUBJECTIVE:   She is postoperative day #1 status post ERCP with stone extraction. Last evening she developed rather significant epigastric pain. There has been no nausea no vomiting. Lipase is elevated at 2400 and liver function tests this morning are deranged.  REVIEW OF SYSTEMS:   Review of Systems  Constitutional: Negative for fever and chills.  HENT: Negative.   Cardiovascular: Negative for chest pain.  Gastrointestinal: Positive for abdominal pain. Negative for nausea and vomiting.  Neurological: Negative.   Psychiatric/Behavioral: Negative.   All other systems reviewed and are negative.   DRUG ALLERGIES:  No Known Allergies  VITALS:  Blood pressure 127/80, pulse 70, temperature 98 F (36.7 C), temperature source Oral, resp. rate 16, height 5\' 2"  (1.575 m), weight 65.635 kg (144 lb 11.2 oz), last menstrual period 02/26/2015, SpO2 100 %.  PHYSICAL EXAMINATION:   GENERAL:  24 y.o.-year-old patient lying in the bed with no acute distress.  EYES: Pupils equal, round, reactive to light and accommodation. No scleral icterus. Extraocular muscles intact.  HEENT: Head atraumatic, normocephalic. Oropharynx and nasopharynx clear.  NECK:  Supple, no jugular venous distention. No thyroid enlargement, no tenderness.  LUNGS: Normal breath sounds bilaterally, no wheezing, rales,rhonchi or crepitation. No use of accessory muscles of respiration.  CARDIOVASCULAR: S1, S2 normal. No murmurs, rubs, or gallops.  ABDOMEN: Her abdomen is soft. There is moderate tenderness in the epigastric region.   EXTREMITIES: No pedal edema, cyanosis, or clubbing.  NEUROLOGIC: Cranial nerves II through XII are intact. Muscle strength 5/5 in all extremities. Sensation intact. Gait not checked.  PSYCHIATRIC: The patient is alert and oriented x 3.  SKIN: No obvious rash, lesion, or ulcer.   CBC Latest Ref  Rng 03/12/2015 03/11/2015  WBC 3.6 - 11.0 K/uL 13.2(H) 11.4(H)  Hemoglobin 12.0 - 16.0 g/dL 82.914.3 56.214.3  Hematocrit 13.035.0 - 47.0 % 43.0 41.9  Platelets 150 - 440 K/uL 221 239   CMP Latest Ref Rng 03/12/2015 03/11/2015  Glucose 65 - 99 mg/dL 865(H144(H) 846(N116(H)  BUN 6 - 20 mg/dL 8 11  Creatinine 6.290.44 - 1.00 mg/dL 5.280.62 4.130.86  Sodium 244135 - 145 mmol/L 138 140  Potassium 3.5 - 5.1 mmol/L 4.1 3.7  Chloride 101 - 111 mmol/L 108 105  CO2 22 - 32 mmol/L 26 26  Calcium 8.9 - 10.3 mg/dL 0.1(U8.5(L) 9.4  Total Protein 6.5 - 8.1 g/dL 6.7 7.4  Total Bilirubin 0.3 - 1.2 mg/dL 2.7(O1.8(H) 0.6  Alkaline Phos 38 - 126 U/L 96 65  AST 15 - 41 U/L 271(H) 128(H)  ALT 14 - 54 U/L 326(H) 81(H)   Lipase is 2433.   ASSESSMENT AND PLAN:   24 year old female with choledocholithiasis and cholelithiasis with recent history of biliary colic.   She had successful, albeit, difficult ERCP yesterday with sphincterotomy and stone extraction.    She now has signs and symptoms along with biochemical evidence of post-ERCP related pancreatitis.   Plan is for ice chips today and to hold on laparoscopic cholecystectomy until her pancreatitis resolves.   This plan was discussed with her and her significant other and all other questions were answered.

## 2015-03-13 ENCOUNTER — Encounter
Admission: EM | Disposition: A | Discharge status: Home / Self Care | Payer: Self-pay | Source: Home / Self Care | Attending: Surgery

## 2015-03-13 LAB — COMPREHENSIVE METABOLIC PANEL
ALT: 164 U/L — ABNORMAL HIGH (ref 14–54)
AST: 62 U/L — AB (ref 15–41)
Albumin: 2.9 g/dL — ABNORMAL LOW (ref 3.5–5.0)
Alkaline Phosphatase: 74 U/L (ref 38–126)
Anion gap: 6 (ref 5–15)
BILIRUBIN TOTAL: 0.9 mg/dL (ref 0.3–1.2)
BUN: <5 mg/dL — ABNORMAL LOW (ref 6–20)
CO2: 25 mmol/L (ref 22–32)
CREATININE: 0.61 mg/dL (ref 0.44–1.00)
Calcium: 7.9 mg/dL — ABNORMAL LOW (ref 8.9–10.3)
Chloride: 104 mmol/L (ref 101–111)
GFR calc Af Amer: >60 mL/min (ref >60)
Glucose, Bld: 95 mg/dL (ref 65–99)
Potassium: 3.5 mmol/L (ref 3.5–5.1)
Sodium: 135 mmol/L (ref 135–145)
Total Protein: 5.8 g/dL — ABNORMAL LOW (ref 6.5–8.1)

## 2015-03-13 LAB — LIPASE, BLOOD
Lipase: 1289 U/L — ABNORMAL HIGH (ref 22–51)
Lipase: 1138 U/L — ABNORMAL HIGH (ref 22–51)

## 2015-03-13 SURGERY — LAPAROSCOPIC CHOLECYSTECTOMY
Anesthesia: General

## 2015-03-13 MED ORDER — SODIUM CHLORIDE 0.9 % IV SOLN
INTRAVENOUS | Status: DC
  Administered 2015-03-13 – 2015-03-16 (×6): via INTRAVENOUS

## 2015-03-13 MED ORDER — KETOROLAC TROMETHAMINE 30 MG/ML IJ SOLN
30.0000 mg | Freq: Three times a day (TID) | INTRAMUSCULAR | Status: AC
  Administered 2015-03-13 (×3): 30 mg via INTRAVENOUS
  Filled 2015-03-13 (×3): qty 1

## 2015-03-13 SURGICAL SUPPLY — 45 items
APPLIER CLIP 5 13 M/L LIGAMAX5 (MISCELLANEOUS)
BAG COUNTER SPONGE EZ (MISCELLANEOUS) IMPLANT
BULB RESERV EVAC DRAIN JP 100C (MISCELLANEOUS) IMPLANT
CANISTER SUCT 1200ML W/VALVE (MISCELLANEOUS) IMPLANT
CATH CHOLANG 76X19 KUMAR (CATHETERS) IMPLANT
CHLORAPREP W/TINT 26ML (MISCELLANEOUS) IMPLANT
CLIP APPLIE 5 13 M/L LIGAMAX5 (MISCELLANEOUS) IMPLANT
CLOSURE WOUND 1/2 X4 (GAUZE/BANDAGES/DRESSINGS)
COUNTER SPONGE BAG EZ (MISCELLANEOUS)
DEFOGGER SCOPE WARMER CLEARIFY (MISCELLANEOUS) IMPLANT
DISSECTOR KITTNER STICK (MISCELLANEOUS) IMPLANT
DISSECTORS/KITTNER STICK (MISCELLANEOUS)
DRAIN CHANNEL JP 19F (MISCELLANEOUS) IMPLANT
DRAPE SHEET LG 3/4 BI-LAMINATE (DRAPES) IMPLANT
DRAPE UTILITY 15X26 TOWEL STRL (DRAPES) IMPLANT
DRSG TEGADERM 2-3/8X2-3/4 SM (GAUZE/BANDAGES/DRESSINGS) IMPLANT
DRSG TELFA 3X8 NADH (GAUZE/BANDAGES/DRESSINGS) IMPLANT
ENDOPOUCH RETRIEVER 10 (MISCELLANEOUS) IMPLANT
GLOVE BIO SURGEON STRL SZ7.5 (GLOVE) IMPLANT
GOWN STRL REUS W/ TWL LRG LVL3 (GOWN DISPOSABLE) IMPLANT
GOWN STRL REUS W/ TWL XL LVL3 (GOWN DISPOSABLE) IMPLANT
GOWN STRL REUS W/TWL LRG LVL3 (GOWN DISPOSABLE)
GOWN STRL REUS W/TWL XL LVL3 (GOWN DISPOSABLE)
IRRIGATION STRYKERFLOW (MISCELLANEOUS) IMPLANT
IRRIGATOR STRYKERFLOW (MISCELLANEOUS)
LABEL OR SOLS (LABEL) IMPLANT
NEEDLE 18GX1X1/2 (RX/OR ONLY) (NEEDLE) IMPLANT
NEEDLE HYPO 25X1 1.5 SAFETY (NEEDLE) IMPLANT
NS IRRIG 500ML POUR BTL (IV SOLUTION) IMPLANT
PACK LAP CHOLECYSTECTOMY (MISCELLANEOUS) IMPLANT
PAD GROUND ADULT SPLIT (MISCELLANEOUS) IMPLANT
SCISSORS METZENBAUM CVD 33 (INSTRUMENTS) IMPLANT
SLEEVE ADV FIXATION 5X100MM (TROCAR) IMPLANT
STRAP SAFETY BODY (MISCELLANEOUS) IMPLANT
STRIP CLOSURE SKIN 1/2X4 (GAUZE/BANDAGES/DRESSINGS) IMPLANT
SUT ETHILON 3-0 FS-10 30 BLK (SUTURE)
SUT VIC AB 0 UR5 27 (SUTURE) IMPLANT
SUT VIC AB 4-0 FS2 27 (SUTURE) IMPLANT
SUTURE EHLN 3-0 FS-10 30 BLK (SUTURE) IMPLANT
SWABSTK COMLB BENZOIN TINCTURE (MISCELLANEOUS) IMPLANT
SYR 3ML LL SCALE MARK (SYRINGE) IMPLANT
TROCAR XCEL BLUNT TIP 100MML (ENDOMECHANICALS) IMPLANT
TROCAR Z-THREAD FIOS 11X100 BL (TROCAR) IMPLANT
TROCAR Z-THREAD SLEEVE 11X100 (TROCAR) IMPLANT
TUBING INSUFFLATOR HI FLOW (MISCELLANEOUS) IMPLANT

## 2015-03-13 NOTE — Anesthesia Preprocedure Evaluation (Deleted)
Anesthesia Evaluation    Airway        Dental   Pulmonary          Cardiovascular     Neuro/Psych    GI/Hepatic   Endo/Other    Renal/GU      Musculoskeletal   Abdominal   Peds  Hematology   Anesthesia Other Findings 24 yo F w choledocholithiasis, ERCP 7/1, post ERCP pancreatitis. Still has abdominal pain in the left upper quadrant that is difficult enough to require pain medication. He has had no nausea or vomiting.  Reproductive/Obstetrics                             Anesthesia Physical Anesthesia Plan  ASA: II  Anesthesia Plan:    Post-op Pain Management:    Induction:   Airway Management Planned:   Additional Equipment:   Intra-op Plan:   Post-operative Plan:   Informed Consent:   Plan Discussed with:   Anesthesia Plan Comments:         Anesthesia Quick Evaluation

## 2015-03-13 NOTE — Consult Note (Signed)
Subjective: Patient seen for biliary pancreatitis. She with improved symptoms since last night. Is no nausea. She states her pain is about a 4-5/10. She is passing flatus. A clear liquid diet was placed for her.  Objective: Vital signs in last 24 hours: Temp:  [98.3 F (36.8 C)-100.1 F (37.8 C)] 100.1 F (37.8 C) (07/03 0814) Pulse Rate:  [98-113] 113 (07/03 0814) Resp:  [16-17] 16 (07/03 0814) BP: (118-125)/(69-84) 118/74 mmHg (07/03 0814) SpO2:  [95 %-100 %] 96 % (07/03 0814) Blood pressure 118/74, pulse 113, temperature 100.1 F (37.8 C), temperature source Oral, resp. rate 16, height  (1.575 m), weight 65.635 kg (144 lb 11.2 oz), last menstrual period 02/26/2015, SpO2 96 %.   Intake/Output from previous day: 07/02 0701 - 07/03 0700 In: 3473.3 [P.O.:120; I.V.:3074.2; IV Piggyback:279.1] Out: 1425 [Urine:1425]  Intake/Output this shift: Total I/O In: 715.4 [I.V.:615.4; IV Piggyback:100] Out: 300 [Urine:300]   General appearance:  Well-appearing 24 year old female no acute distress Resp:  Clear to auscultation Cardio:  Regular rate and rhythm GI:  Soft minimal tenderness to palpation in the epigastric region. There is some tenderness to palpation in the right upper quadrant. There are no masses rebound or organomegaly. Bowel sounds are positive. Extremities:  No clubbing cyanosis or edema   Lab Results: Results for orders placed or performed during the hospital encounter of 03/11/15 (from the past 24 hour(s))  Pregnancy, urine     Status: None   Collection Time: 03/12/15 10:12 PM  Result Value Ref Range   Preg Test, Ur NEGATIVE NEGATIVE  Lipase, blood     Status: Abnormal   Collection Time: 03/12/15 11:25 PM  Result Value Ref Range   Lipase 1289 (H) 22 - 51 U/L  Lipase, blood     Status: Abnormal   Collection Time: 03/13/15  4:35 AM  Result Value Ref Range   Lipase 1138 (H) 22 - 51 U/L  Comprehensive metabolic panel     Status: Abnormal   Collection Time:  03/13/15  4:35 AM  Result Value Ref Range   Sodium 135 135 - 145 mmol/L   Potassium 3.5 3.5 - 5.1 mmol/L   Chloride 104 101 - 111 mmol/L   CO2 25 22 - 32 mmol/L   Glucose, Bld 95 65 - 99 mg/dL   BUN <5 (L) 6 - 20 mg/dL   Creatinine, Ser 1.61 0.44 - 1.00 mg/dL   Calcium 7.9 (L) 8.9 - 10.3 mg/dL   Total Protein 5.8 (L) 6.5 - 8.1 g/dL   Albumin 2.9 (L) 3.5 - 5.0 g/dL   AST 62 (H) 15 - 41 U/L   ALT 164 (H) 14 - 54 U/L   Alkaline Phosphatase 74 38 - 126 U/L   Total Bilirubin 0.9 0.3 - 1.2 mg/dL   GFR calc non Af Amer >60 >60 mL/min   GFR calc Af Amer >60 >60 mL/min   Anion gap 6 5 - 15      Recent Labs  03/11/15 0347 03/12/15 0416  WBC 11.4* 13.2*  HGB 14.3 14.3  HCT 41.9 43.0  PLT 239 221   BMET  Recent Labs  03/11/15 0347 03/12/15 0416 03/13/15 0435  NA 140 138 135  K 3.7 4.1 3.5  CL 105 108 104  CO2 GLUCOSE 116* 144* 95  BUN 11 8 <5*  CREATININE 0.86 0.62 0.61  CALCIUM 9.4 8.5* 7.9*   LFT  Recent Labs  03/13/15 0435  PROT 5.8*  ALBUMIN 2.9*  AST  62*  ALT 164*  ALKPHOS 74  BILITOT 0.9   PT/INR No results for input(s): LABPROT, INR in the last 72 hours. Hepatitis Panel No results for input(s): HEPBSAG, HCVAB, HEPAIGM, HEPBIGM in the last 72 hours. C-Diff No results for input(s): CDIFFTOX in the last 72 hours. No results for input(s): CDIFFPCR in the last 72 hours.   Studies/Results: No results found.  Scheduled Inpatient Medications:   . ampicillin-sulbactam (UNASYN) IV  3 g Intravenous Q6H  . enoxaparin (LOVENOX) injection  40 mg Subcutaneous Q24H  . ketorolac  30 mg Intravenous 3 times per day    Continuous Inpatient Infusions:   . sodium chloride 800 mL (03/13/15 0831)    PRN Inpatient Medications:  diphenhydrAMINE, HYDROmorphone (DILAUDID) injection, ondansetron **OR** ondansetron (ZOFRAN) IV  Miscellaneous:   Assessment:  1. Biliary pancreatitis, improving clinically. Lipase is still elevated however also  improving. 2. Cholelithiasis/cholecystitis  Plan:  1. Continue current 2. Cholecystectomy when clinically feasible  Christena DeemMartin U Skulskie MD 03/13/2015, 1:28 PM

## 2015-03-13 NOTE — Progress Notes (Signed)
[facility]   PATIENT NAME: Samantha LankHolly Lawson    MR#:  [id number]  DATE OF BIRTH:  June 27, 1991  SUBJECTIVE:   She is continuing to have significant amount of pain. She is requiring and dilated every 2-4 hours. She's had a total of 10 mg dilaudid for all of 03/12/2015 and 3 mg since midnight.  Passing flatus No n/v  REVIEW OF SYSTEMS:   Review of Systems  Constitutional: Negative for fever and chills.  Cardiovascular: Negative for chest pain.  Gastrointestinal: Positive for abdominal pain. Negative for nausea, vomiting, diarrhea, constipation, blood in stool and melena.  Neurological: Positive for weakness. Negative for headaches.  All other systems reviewed and are negative.   DRUG ALLERGIES:  No Known Allergies  VITALS:  Blood pressure 118/74, pulse 113, temperature 100.1 F (37.8 C), temperature source Oral, resp. rate 16, height 5\' 2"  (1.575 m), weight 65.635 kg (144 lb 11.2 oz), last menstrual period 02/26/2015, SpO2 96 %.   Filed Vitals:   03/12/15 1545 03/12/15 2352 03/13/15 0643 03/13/15 0814  BP: 125/84 118/71 119/69 118/74  Pulse: 98 101 111 113  Temp: 98.3 F (36.8 C) 99.2 F (37.3 C) 99.6 F (37.6 C) 100.1 F (37.8 C)  TempSrc: Oral Oral Oral Oral  Resp: 16 17  16   Height:      Weight:      SpO2: 100% 100% 95% 96%     PHYSICAL EXAMINATION:  GENERAL:  24 y.o.-year-old patient lying in the bed with no acute distress.  EYES: Pupils equal, round, reactive to light and accommodation. No scleral icterus. Extraocular muscles intact.  HEENT: Head atraumatic, normocephalic. Oropharynx and nasopharynx clear.  NECK:  Supple, no jugular venous distention. No thyroid enlargement, no tenderness.  LUNGS: Normal breath sounds bilaterally, no wheezing, rales,rhonchi or crepitation. No use of accessory muscles of respiration.  CARDIOVASCULAR: S1, S2 normal. No murmurs, rubs, or gallops.  ABDOMEN: Soft, tender in RUQ and epigastrum. EXTREMITIES: No pedal edema,  cyanosis, or clubbing.  NEUROLOGIC: Cranial nerves II through XII are intact. Muscle strength 5/5 in all extremities. Sensation intact. Gait not checked.  PSYCHIATRIC: The patient is alert and oriented x 3.  SKIN: No obvious rash, lesion, or ulcer.   CBC Latest Ref Rng 03/12/2015 03/11/2015  WBC 3.6 - 11.0 K/uL 13.2(H) 11.4(H)  Hemoglobin 12.0 - 16.0 g/dL 40.914.3 81.114.3  Hematocrit 91.435.0 - 47.0 % 43.0 41.9  Platelets 150 - 440 K/uL 221 239    CMP Latest Ref Rng 03/13/2015 03/12/2015 03/11/2015  Glucose 65 - 99 mg/dL 95 782(N144(H) 562(Z116(H)  BUN 6 - 20 mg/dL <3(Y<5(L) 8 11  Creatinine 0.44 - 1.00 mg/dL 8.650.61 7.840.62 6.960.86  Sodium 135 - 145 mmol/L 135 138 140  Potassium 3.5 - 5.1 mmol/L 3.5 4.1 3.7  Chloride 101 - 111 mmol/L 104 108 105  CO2 22 - 32 mmol/L 25 26 26   Calcium 8.9 - 10.3 mg/dL 7.9(L) 8.5(L) 9.4  Total Protein 6.5 - 8.1 g/dL 2.9(B5.8(L) 6.7 7.4  Total Bilirubin 0.3 - 1.2 mg/dL 0.9 2.8(U1.8(H) 0.6  Alkaline Phos 38 - 126 U/L 74 96 65  AST 15 - 41 U/L 62(H) 271(H) 128(H)  ALT 14 - 54 U/L 164(H) 326(H) 81(H)    Lipase  1138 2433 yesterday. ASSESSMENT AND PLAN:   Choledocholithiasis Cholecystitis, chronic Post ERCP related pancreatitis  She has persistently elevated lipase and symptoms. She is requiring a large amount of narcotic medication. I would like to wait until her pancreatitis resolves before proceeding with elective cholecystectomy.  Clearly however lipase normalizes and she continues to have pain in her gallbladder is the source of her issues at that time. I discussed this with her and her fianc and she understands and wishes to proceed with this course of action. Dr. Michela Pitcher will assume her care on Monday.

## 2015-03-13 NOTE — Anesthesia Preprocedure Evaluation (Deleted)
Anesthesia Evaluation  Patient identified by MRN, date of birth, ID band Patient awake    Reviewed: Allergy & Precautions, NPO status , Patient's Chart, lab work & pertinent test results, reviewed documented beta blocker date and time   Airway Mallampati: II  TM Distance: >3 FB     Dental  (+) Chipped   Pulmonary          Cardiovascular     Neuro/Psych    GI/Hepatic   Endo/Other    Renal/GU      Musculoskeletal   Abdominal   Peds  Hematology   Anesthesia Other Findings 24 yo F w choledocholithiasis, ERCP 7/1, post ERCP pancreatitis. Still has abdominal pain in the left upper quadrant that is difficult enough to require pain medication. He has had no nausea or vomiting.  Reproductive/Obstetrics                             Anesthesia Physical  Anesthesia Plan  ASA: II  Anesthesia Plan: General ETT   Post-op Pain Management:    Induction: Intravenous  Airway Management Planned: Oral ETT and Nasal Cannula  Additional Equipment:   Intra-op Plan:   Post-operative Plan:   Informed Consent:   Plan Discussed with:   Anesthesia Plan Comments:         Anesthesia Quick Evaluation

## 2015-03-14 LAB — LIPASE, BLOOD: Lipase: 184 U/L — ABNORMAL HIGH (ref 22–51)

## 2015-03-14 LAB — CBC
HCT: 38.3 % (ref 35.0–47.0)
Hemoglobin: 12.7 g/dL (ref 12.0–16.0)
MCH: 30.8 pg (ref 26.0–34.0)
MCHC: 33.1 g/dL (ref 32.0–36.0)
MCV: 93.1 fL (ref 80.0–100.0)
Platelets: 204 10*3/uL (ref 150–440)
RBC: 4.11 MIL/uL (ref 3.80–5.20)
RDW: 12.7 % (ref 11.5–14.5)
WBC: 11.3 10*3/uL — ABNORMAL HIGH (ref 3.6–11.0)

## 2015-03-14 LAB — COMPREHENSIVE METABOLIC PANEL
ALT: 104 U/L — ABNORMAL HIGH (ref 14–54)
ANION GAP: 8 (ref 5–15)
AST: 28 U/L (ref 15–41)
Albumin: 2.8 g/dL — ABNORMAL LOW (ref 3.5–5.0)
Alkaline Phosphatase: 75 U/L (ref 38–126)
BILIRUBIN TOTAL: 0.9 mg/dL (ref 0.3–1.2)
BUN: 6 mg/dL (ref 6–20)
CO2: 23 mmol/L (ref 22–32)
Calcium: 8 mg/dL — ABNORMAL LOW (ref 8.9–10.3)
Chloride: 106 mmol/L (ref 101–111)
Creatinine, Ser: 0.59 mg/dL (ref 0.44–1.00)
GFR calc Af Amer: >60 mL/min (ref >60)
GFR calc non Af Amer: >60 mL/min (ref >60)
Glucose, Bld: 87 mg/dL (ref 65–99)
Potassium: 3.4 mmol/L — ABNORMAL LOW (ref 3.5–5.1)
Sodium: 137 mmol/L (ref 135–145)
Total Protein: 6 g/dL — ABNORMAL LOW (ref 6.5–8.1)

## 2015-03-14 MED ORDER — ACETAMINOPHEN 325 MG PO TABS: 650.0000 mg | ORAL_TABLET | Freq: Four times a day (QID) | ORAL | Status: DC | PRN

## 2015-03-14 NOTE — Consult Note (Addendum)
Subjective: Patient seen for cholecystitis, cholelithiasis, choledocholithiasis, post ercp pancreatitis.  tolerting clears.  Denies abdominal pain, no n/v.  Patient reports a small bm this am.   Objective: Vital signs in last 24 hours: Temp:  [99 F (37.2 C)-99.6 F (37.6 C)] 99.6 F (37.6 C) (07/04 0739) Pulse Rate:  [102-112] 108 (07/04 0739) Resp:  [16-17] 16 (07/04 0739) BP: (119-126)/(68-79) 119/79 mmHg (07/04 0739) SpO2:  [98 %-99 %] 98 % (07/04 0739) Blood pressure 119/79, pulse 108, temperature 99.6 F (37.6 C), temperature source Oral, resp. rate 16, height  (1.575 m), weight 65.635 kg (144 lb 11.2 oz), last menstrual period 02/26/2015, SpO2 98 %.   Intake/Output from previous day: 07/03 0701 - 07/04 0700 In: 2776.4 [P.O.:120; I.V.:2456.4; IV Piggyback:200] Out: 1650 [Urine:1650]  Intake/Output this shift: Total I/O In: 897 [P.O.:240; I.V.:657] Out: 150 [Urine:150]   General appearance:  Well appearing nad Resp:  bcta Cardio:  rrr without rmg GI:  Soft, nt/nd/bowel sounds positive.  Extremities:  No cce   Lab Results: Results for orders placed or performed during the hospital encounter of 03/11/15 (from the past 24 hour(s))  Lipase, blood     Status: Abnormal   Collection Time: 03/14/15  5:50 AM  Result Value Ref Range   Lipase 184 (H) 22 - 51 U/L  CBC     Status: Abnormal   Collection Time: 03/14/15  5:50 AM  Result Value Ref Range   WBC 11.3 (H) 3.6 - 11.0 K/uL   RBC 4.11 3.80 - 5.20 MIL/uL   Hemoglobin 12.7 12.0 - 16.0 g/dL   HCT 16.1 09.6 - 04.5 %   MCV 93.1 80.0 - 100.0 fL   MCH 30.8 26.0 - 34.0 pg   MCHC 33.1 32.0 - 36.0 g/dL   RDW 40.9 81.1 - 91.4 %   Platelets 204 150 - 440 K/uL  Comprehensive metabolic panel     Status: Abnormal   Collection Time: 03/14/15  5:50 AM  Result Value Ref Range   Sodium 137 135 - 145 mmol/L   Potassium 3.4 (L) 3.5 - 5.1 mmol/L   Chloride 106 101 - 111 mmol/L   CO2 23 22 - 32 mmol/L   Glucose, Bld 87 65 - 99  mg/dL   BUN 6 6 - 20 mg/dL   Creatinine, Ser 7.82 0.44 - 1.00 mg/dL   Calcium 8.0 (L) 8.9 - 10.3 mg/dL   Total Protein 6.0 (L) 6.5 - 8.1 g/dL   Albumin 2.8 (L) 3.5 - 5.0 g/dL   AST 28 15 - 41 U/L   ALT 104 (H) 14 - 54 U/L   Alkaline Phosphatase 75 38 - 126 U/L   Total Bilirubin 0.9 0.3 - 1.2 mg/dL   GFR calc non Af Amer >60 >60 mL/min   GFR calc Af Amer >60 >60 mL/min   Anion gap 8 5 - 15      Recent Labs  03/12/15 0416 03/14/15 0550  WBC 13.2* 11.3*  HGB 14.3 12.7  HCT 43.0 38.3  PLT 221 204   BMET  Recent Labs  03/12/15 0416 03/13/15 0435 03/14/15 0550  NA 138 135 137  K 4.1 3.5 3.4*  CL 108 104 106  CO2 GLUCOSE 144* 95 87  BUN 8 <5* 6  CREATININE 0.62 0.61 0.59  CALCIUM 8.5* 7.9* 8.0*   LFT  Recent Labs  03/14/15 0550  PROT 6.0*  ALBUMIN 2.8*  AST 28  ALT 104*  ALKPHOS 75  BILITOT  0.9   PT/INR No results for input(s): LABPROT, INR in the last 72 hours. Hepatitis Panel No results for input(s): HEPBSAG, HCVAB, HEPAIGM, HEPBIGM in the last 72 hours. C-Diff No results for input(s): CDIFFTOX in the last 72 hours. No results for input(s): CDIFFPCR in the last 72 hours.   Studies/Results: No results found.  Scheduled Inpatient Medications:   . ampicillin-sulbactam (UNASYN) IV  3 g Intravenous Q6H  . enoxaparin (LOVENOX) injection  40 mg Subcutaneous Q24H    Continuous Inpatient Infusions:   . sodium chloride 100 mL/hr at 03/14/15 0537    PRN Inpatient Medications:  diphenhydrAMINE, HYDROmorphone (DILAUDID) injection, ondansetron **OR** ondansetron (ZOFRAN) IV  Miscellaneous:   Assessment:  1) post ercp pancreatitis-resolved.  2) cholelithiasis, choledocholithiasis (s/p ercp with stone extractions)  Plan:  1) CCY when clinically feasible.  No new GI recs.   Christena DeemMartin U Mars Scheaffer MD 03/14/2015, 3:26 PM

## 2015-03-14 NOTE — Progress Notes (Addendum)
Dr. Juliann PulseLundquist notified of elevated temp. Need tylenol ordered. MD will place order. Removed blankets and room temp turned down. Will recheck in 1 hr.

## 2015-03-14 NOTE — Progress Notes (Signed)
I have reviewed her workup and progress to date. Her lipase is normal today and she is no longer having abdominal pain. She is not nauseated. We discussed the situation with her in detail. She would like to consider surgical intervention. We will move to the plan of laparoscopic cholecystectomy later today as outlined to her. She is in agreement.

## 2015-03-14 NOTE — Progress Notes (Signed)
There are some scheduling difficulties today and her surgery will be postponed until later this evening. I talk with her length. Because of the possible issues with regard to her pancreatitis I am not comfortable performing this operation on the evening shift. We will delay her surgery to tomorrow morning. I discussed this plan with her and while she is reluctant to delay she is in agreement with the plan.

## 2015-03-15 ENCOUNTER — Encounter: Payer: Self-pay | Admitting: Gastroenterology

## 2015-03-15 ENCOUNTER — Encounter
Admission: EM | Disposition: A | Discharge status: Home / Self Care | Payer: Self-pay | Source: Home / Self Care | Attending: Surgery

## 2015-03-15 ENCOUNTER — Inpatient Hospital Stay: Payer: Medicaid Other | Admitting: Registered Nurse

## 2015-03-15 DIAGNOSIS — K851 Biliary acute pancreatitis without necrosis or infection: Secondary | ICD-10-CM | POA: Diagnosis present

## 2015-03-15 DIAGNOSIS — K8051 Calculus of bile duct without cholangitis or cholecystitis with obstruction: Secondary | ICD-10-CM

## 2015-03-15 HISTORY — PX: CHOLECYSTECTOMY: SHX55

## 2015-03-15 SURGERY — LAPAROSCOPIC CHOLECYSTECTOMY
Anesthesia: General | Wound class: Contaminated

## 2015-03-15 MED ORDER — SUCCINYLCHOLINE CHLORIDE 20 MG/ML IJ SOLN
INTRAMUSCULAR | Status: DC | PRN
  Administered 2015-03-15: 30 mg via INTRAVENOUS

## 2015-03-15 MED ORDER — HEPARIN SODIUM (PORCINE) 5000 UNIT/ML IJ SOLN
INTRAMUSCULAR | Status: DC | PRN
  Administered 2015-03-15: 5000 [IU] via SUBCUTANEOUS

## 2015-03-15 MED ORDER — OXYCODONE-ACETAMINOPHEN 5-325 MG PO TABS
1.0000 | ORAL_TABLET | Freq: Four times a day (QID) | ORAL | Status: DC | PRN
  Administered 2015-03-16: 2 via ORAL
  Filled 2015-03-15: qty 2

## 2015-03-15 MED ORDER — ONDANSETRON HCL 4 MG/2ML IJ SOLN: 4.0000 mg | Freq: Once | INTRAMUSCULAR | Status: AC | PRN

## 2015-03-15 MED ORDER — MIDAZOLAM HCL 2 MG/2ML IJ SOLN
INTRAMUSCULAR | Status: DC | PRN
  Administered 2015-03-15: 2 mg via INTRAVENOUS

## 2015-03-15 MED ORDER — ROCURONIUM BROMIDE 100 MG/10ML IV SOLN
INTRAVENOUS | Status: DC | PRN
  Administered 2015-03-15: 30 mg via INTRAVENOUS

## 2015-03-15 MED ORDER — PROPOFOL 10 MG/ML IV BOLUS
INTRAVENOUS | Status: DC | PRN
  Administered 2015-03-15: 150 mg via INTRAVENOUS

## 2015-03-15 MED ORDER — FENTANYL CITRATE (PF) 100 MCG/2ML IJ SOLN
INTRAMUSCULAR | Status: DC | PRN
  Administered 2015-03-15: 100 ug via INTRAVENOUS
  Administered 2015-03-15 (×2): 50 ug via INTRAVENOUS

## 2015-03-15 MED ORDER — LACTATED RINGERS IV SOLN
INTRAVENOUS | Status: DC | PRN
  Administered 2015-03-15: 15:00:00 via INTRAVENOUS

## 2015-03-15 MED ORDER — KETOROLAC TROMETHAMINE 30 MG/ML IJ SOLN
INTRAMUSCULAR | Status: DC | PRN
  Administered 2015-03-15: 30 mg via INTRAVENOUS

## 2015-03-15 MED ORDER — LIDOCAINE HCL (CARDIAC) 20 MG/ML IV SOLN
INTRAVENOUS | Status: DC | PRN
  Administered 2015-03-15: 100 mg via INTRAVENOUS

## 2015-03-15 MED ORDER — BUPIVACAINE HCL (PF) 0.25 % IJ SOLN
INTRAMUSCULAR | Status: DC | PRN
  Administered 2015-03-15: 30 mL

## 2015-03-15 MED ORDER — DEXAMETHASONE SODIUM PHOSPHATE 4 MG/ML IJ SOLN
INTRAMUSCULAR | Status: DC | PRN
Start: 2015-03-15 — End: 2015-03-15
  Administered 2015-03-15: 10 mg via INTRAVENOUS

## 2015-03-15 MED ORDER — BUPIVACAINE HCL (PF) 0.25 % IJ SOLN
INTRAMUSCULAR | Status: AC
  Filled 2015-03-15: qty 30

## 2015-03-15 MED ORDER — HEPARIN SODIUM (PORCINE) 5000 UNIT/ML IJ SOLN
INTRAMUSCULAR | Status: AC
  Filled 2015-03-15: qty 1

## 2015-03-15 MED ORDER — ACETAMINOPHEN 10 MG/ML IV SOLN
INTRAVENOUS | Status: AC
  Filled 2015-03-15: qty 100

## 2015-03-15 MED ORDER — FENTANYL CITRATE (PF) 100 MCG/2ML IJ SOLN
INTRAMUSCULAR | Status: AC
Start: 2015-03-15 — End: 2015-03-15
  Administered 2015-03-15: 25 ug via INTRAVENOUS
  Filled 2015-03-15: qty 2

## 2015-03-15 MED ORDER — ACETAMINOPHEN 10 MG/ML IV SOLN
INTRAVENOUS | Status: DC | PRN
  Administered 2015-03-15: 1000 mg via INTRAVENOUS

## 2015-03-15 MED ORDER — SUGAMMADEX SODIUM 200 MG/2ML IV SOLN
INTRAVENOUS | Status: DC | PRN
  Administered 2015-03-15: 131.2 mg via INTRAVENOUS

## 2015-03-15 MED ORDER — FENTANYL CITRATE (PF) 100 MCG/2ML IJ SOLN
25.0000 ug | INTRAMUSCULAR | Status: DC | PRN
  Administered 2015-03-15 (×4): 25 ug via INTRAVENOUS

## 2015-03-15 SURGICAL SUPPLY — 43 items
APPLIER CLIP ROT 10 11.4 M/L (STAPLE)
BAG COUNTER SPONGE EZ (MISCELLANEOUS) IMPLANT
CANISTER SUCT 1200ML W/VALVE (MISCELLANEOUS) ×3 IMPLANT
CATH REDDICK CHOLANGI 4FR 50CM (CATHETERS) IMPLANT
CHLORAPREP W/TINT 26ML (MISCELLANEOUS) ×3 IMPLANT
CLIP APPLIE ROT 10 11.4 M/L (STAPLE) IMPLANT
CONRAY 60ML FOR OR (MISCELLANEOUS) IMPLANT
COUNTER SPONGE BAG EZ (MISCELLANEOUS)
DRAPE SHEET LG 3/4 BI-LAMINATE (DRAPES) ×6 IMPLANT
DRSG TEGADERM 2-3/8X2-3/4 SM (GAUZE/BANDAGES/DRESSINGS) ×12 IMPLANT
DRSG TELFA 3X8 NADH (GAUZE/BANDAGES/DRESSINGS) ×3 IMPLANT
GLOVE BIO SURGEON STRL SZ7.5 (GLOVE) ×3 IMPLANT
GLOVE INDICATOR 8.0 STRL GRN (GLOVE) ×3 IMPLANT
GOWN STRL REUS W/ TWL LRG LVL3 (GOWN DISPOSABLE) ×3 IMPLANT
GOWN STRL REUS W/TWL LRG LVL3 (GOWN DISPOSABLE) ×6
GRASPER SUT TROCAR 14GX15 (MISCELLANEOUS) ×3 IMPLANT
IRRIGATION STRYKERFLOW (MISCELLANEOUS) ×1 IMPLANT
IRRIGATOR STRYKERFLOW (MISCELLANEOUS) ×3
IV NS 1000ML (IV SOLUTION) ×2
IV NS 1000ML BAXH (IV SOLUTION) ×1 IMPLANT
LABEL OR SOLS (LABEL) IMPLANT
NDL SAFETY 18GX1.5 (NEEDLE) IMPLANT
NDL SAFETY 25GX1.5 (NEEDLE) IMPLANT
NEEDLE 18GX1X1/2 (RX/OR ONLY) (NEEDLE) ×3 IMPLANT
NEEDLE HYPO 25X1 1.5 SAFETY (NEEDLE) ×3 IMPLANT
NEEDLE INSUFFLATION 14GA 120MM (NEEDLE) ×3 IMPLANT
NS IRRIG 500ML POUR BTL (IV SOLUTION) ×3 IMPLANT
PACK LAP CHOLECYSTECTOMY (MISCELLANEOUS) ×3 IMPLANT
PAD GROUND ADULT SPLIT (MISCELLANEOUS) ×3 IMPLANT
POUCH ENDO CATCH 10MM SPEC (MISCELLANEOUS) IMPLANT
SCISSORS METZENBAUM CVD 33 (INSTRUMENTS) ×3 IMPLANT
SEAL FOR SCOPE WARMER C3101 (MISCELLANEOUS) IMPLANT
SLEEVE ADV FIXATION 5X100MM (TROCAR) ×3 IMPLANT
STRAP SAFETY BODY (MISCELLANEOUS) ×3 IMPLANT
SUT CHROMIC 5 0 RB 1 27 (SUTURE) ×3 IMPLANT
SUT ETHILON 5-0 FS-2 18 BLK (SUTURE) IMPLANT
SUT VIC AB 0 CT2 27 (SUTURE) ×3 IMPLANT
SYR 3ML LL SCALE MARK (SYRINGE) ×3 IMPLANT
TROCAR Z-THREAD FIOS 11X100 BL (TROCAR) ×3 IMPLANT
TROCAR Z-THREAD OPTICAL 5X100M (TROCAR) ×3 IMPLANT
TROCAR Z-THREAD SLEEVE 11X100 (TROCAR) ×3 IMPLANT
TUBING INSUFFLATOR HI FLOW (MISCELLANEOUS) ×6 IMPLANT
WATER STERILE IRR 1000ML POUR (IV SOLUTION) ×3 IMPLANT

## 2015-03-15 NOTE — Op Note (Signed)
03/11/2015 - 03/15/2015  4:03 PM  PATIENT:  Samantha LankHolly Lawson  24 y.o. female  PRE-OPERATIVE DIAGNOSIS:  Cholecystitis, Cholelithiasis  POST-OPERATIVE DIAGNOSIS:  Cholecystitis, Cholelithiasis  PROCEDURE:  Procedure(s): LAPAROSCOPIC CHOLECYSTECTOMY (N/A)  SURGEON:  Surgeon(s) and Role:    * Tiney Rougealph Ely III, MD - Primary   ASSISTANTS: none   ANESTHESIA:   general  EBL:  Total I/O In: 1394.2 [I.V.:1299.2; IV Piggyback:95] Out: 950 [Urine:950]   DRAINS: none   LOCAL MEDICATIONS USED:  BUPIVICAINE    DISPOSITION OF SPECIMEN:  PATHOLOGY   DICTATION: .Dragon Dictation with the patient in supine position and after the induction of appropriate general anesthesia patient abdomen was draped with sterile towels. ChloraPrep was utilized. The patient was then placed in headdown feet up position. Small infraumbilical incision was made standard fashion and carried down through the subcutaneous tissue bluntly. A varies needle was used to cannulate peritoneal cavity. CO2 was insufflated to appropriate pressure measurements. When approximately 2 and half liters of CO2 were instilled a varies needle was withdrawn and an 11 mm medical port inserted in the peritoneal cavity. Intraperitoneal position was confirmed and CO2 was reinsufflated.  The patient was then placed in head up feet down position rotated slightly to the left side. Subxiphoid transverse incision was made 11 mm port inserted under direct vision. 2 lateral ports 5 mm in size were inserted under direct vision. The abdomen is generally inspectedand abnormalities were encountered. The gallbladder was retracted superiorly and laterally exposing the hepatoduodenal ligament. Cystic artery and cystic duct were identified. The gallbladder was injected with mild edema noted. Common bile duct was visualized.  Cystic artery and cystic duct were easily identified. The cystic duct was doubly clipped and divided. Cystic artery was doubly clipped and divided.  The gallbladder was then dissected free from its bed in the liver using the AND cautery apparatus. Once the gallbladder was free was captured through the subxiphoid port and removed without difficulty. The endoscope was copiously suctioned irrigated. All ports removed without difficulty. Skin incisions were closed with 5-0 nylon. The area was infiltrated with 0.25% Marcaine for postoperative pain control. Sterile dressings were applied. Patient returned recovery room having tolerated procedure well. Sponge instrument needle count correct 2 in the operative.  PLAN OF CARE: Admit to inpatient   PATIENT DISPOSITION:  PACU - hemodynamically stable.   Tiney Rougealph Ely III, MD

## 2015-03-15 NOTE — Transfer of Care (Signed)
Immediate Anesthesia Transfer of Care Note  Patient: Samantha LankHolly Feltus  Procedure(s) Performed: Procedure(s): LAPAROSCOPIC CHOLECYSTECTOMY (N/A)  Patient Location: PACU  Anesthesia Type:General  Level of Consciousness: oriented and sedated  Airway & Oxygen Therapy: Patient Spontanous Breathing and Patient connected to face mask oxygen  Post-op Assessment: Report given to RN  Post vital signs: Reviewed and stable  Last Vitals:  Filed Vitals:   03/15/15 0737  BP: 136/83  Pulse: 112  Temp: 37.3 C  Resp: 18    Complications: No apparent anesthesia complications

## 2015-03-15 NOTE — Anesthesia Procedure Notes (Signed)
Procedure Name: Intubation Date/Time: 03/15/2015 3:10 PM Performed by: Samantha Lawson, Samantha Mareno Pre-anesthesia Checklist: Patient identified, Patient being monitored, Timeout performed, Emergency Drugs available and Suction available Patient Re-evaluated:Patient Re-evaluated prior to inductionOxygen Delivery Method: Circle System Utilized Preoxygenation: Pre-oxygenation with 100% oxygen Intubation Type: IV induction Ventilation: Mask ventilation without difficulty Laryngoscope Size: Mac and 3 Grade View: Grade I Tube type: Oral Tube size: 7.0 mm Number of attempts: 1 Airway Equipment and Method: Stylet Placement Confirmation: ETT inserted through vocal cords under direct vision,  positive ETCO2 and breath sounds checked- equal and bilateral Secured at: 21 cm Tube secured with: Tape Dental Injury: Teeth and Oropharynx as per pre-operative assessment

## 2015-03-15 NOTE — Anesthesia Postprocedure Evaluation (Signed)
  Anesthesia Post-op Note  Patient: Samantha Lawson  Procedure(s) Performed: Procedure(s): LAPAROSCOPIC CHOLECYSTECTOMY (N/A)  Anesthesia type:General ETT  Patient location: PACU  Post pain: Pain level controlled  Post assessment: Post-op Vital signs reviewed, Patient's Cardiovascular Status Stable, Respiratory Function Stable, Patent Airway and No signs of Nausea or vomiting  Post vital signs: Reviewed and stable  Last Vitals:  Filed Vitals:   03/15/15 1720  BP: 135/78  Pulse: 92  Temp: 36.7 C  Resp: 18    Level of consciousness: awake, alert  and patient cooperative  Complications: No apparent anesthesia complications

## 2015-03-15 NOTE — Progress Notes (Signed)
  Subjective: Pt feels much better.  Occasional discomfort LUQ & nausea. 4/10 at worst.  Denies vomiting.  For cholecystectomy today.  Objective: Vital signs in last 24 hours: Temp:  [99.2 F (37.3 C)-100.6 F (38.1 C)] 99.2 F (37.3 C) (07/05 0737) Pulse Rate:  [112-117] 112 (07/05 0737) Resp:  [16-18] 18 (07/05 0737) BP: (126-136)/(81-83) 136/83 mmHg (07/05 0737) SpO2:  [95 %-97 %] 95 % (07/05 0737) Last BM Date: 03/14/15 Patient's last menstrual period was 02/26/2015. Body mass index is 26.46 kg/(m^2). General:   Alert,  Well-developed, well-nourished, pleasant and cooperative in NAD Head:  Normocephalic and atraumatic. Eyes:  Sclera clear, no icterus.   Conjunctiva pink. Mouth:  No deformity or lesions, oropharynx pink & moist. Neck:  Supple; no masses or thyromegaly. Heart:  Regular rate and rhythm; no murmurs, clicks, rubs, or gallops. Abdomen:   Normal bowel sounds.  Soft, nontender and nondistended. No masses, hepatosplenomegaly or hernias noted.  No guarding or rebound tenderness.   Msk:  Symmetrical without gross deformities. Good equal movement & strength bilaterally. Pulses:  Normal pulses noted. Extremities:  Without clubbing or edema.  No cyanosis Neurologic:  Alert and  oriented x3;  grossly normal neurologically. Skin:  Intact without significant lesions or rashes. Cervical Nodes:  No significant cervical adenopathy. Psych:  Alert and cooperative. Normal mood and affect.  Intake/Output from previous day: 07/04 0701 - 07/05 0700 In: 1417 [P.O.:360; I.V.:1057] Out: 1750 [Urine:1750]  Lab Results:  Recent Labs  03/14/15 0550  WBC 11.3*  HGB 12.7  HCT 38.3  PLT 204   BMET  Recent Labs  03/13/15 0435 03/14/15 0550  NA 135 137  K 3.5 3.4*  CL 104 106  CO2 25 23  GLUCOSE 95 87  BUN <5* 6  CREATININE 0.61 0.59  CALCIUM 7.9* 8.0*   LFT  Recent Labs  03/12/15 2325 03/13/15 0435 03/14/15 0550  PROT  --  5.8* 6.0*  ALBUMIN  --  2.9* 2.8*  AST   --  62* 28  ALT  --  164* 104*  ALKPHOS  --  74 75  BILITOT  --  0.9 0.9  LIPASE 1289* 1138* 184*   Assessment: Post-ERCP pancreatitis s/p stone extraction:  Much improved.  For cholecystectomy today.  Plan: #1  Continue supportive measures #2  Cholecystectomy per surgery today  The care of Samantha Lawson will be discussed in direct collaboration with Dr Midge Miniumarren Wohl, Attending Gastroenterologist.  LOS: 4 days  Samantha Lawson  03/15/2015, 9:40 AM [facility]  [address] [place], [place] [address] Phone: [phone] Fax : [phone]

## 2015-03-15 NOTE — Progress Notes (Signed)

## 2015-03-15 NOTE — Anesthesia Preprocedure Evaluation (Addendum)
Anesthesia Evaluation  Patient identified by MRN, date of birth, ID band Patient awake    Reviewed: Allergy & Precautions, H&P , NPO status , Patient's Chart, lab work & pertinent test results, reviewed documented beta blocker date and time   Airway Mallampati: II  TM Distance: >3 FB Neck ROM: full    Dental   Pulmonary neg pulmonary ROS, Current Smoker,          Cardiovascular negative cardio ROS  Rate:Normal     Neuro/Psych negative neurological ROS     GI/Hepatic negative GI ROS, Neg liver ROS, Reported some nausea and vomiting with this admision   Endo/Other  negative endocrine ROS  Renal/GU negative Renal ROS  negative genitourinary   Musculoskeletal   Abdominal   Peds  Hematology negative hematology ROS (+)   Anesthesia Other Findings   Reproductive/Obstetrics negative OB ROS                            Anesthesia Physical Anesthesia Plan  ASA: II  Anesthesia Plan: General ETT   Post-op Pain Management:    Induction:   Airway Management Planned:   Additional Equipment:   Intra-op Plan:   Post-operative Plan:   Informed Consent: I have reviewed the patients History and Physical, chart, labs and discussed the procedure including the risks, benefits and alternatives for the proposed anesthesia with the patient or authorized representative who has indicated his/her understanding and acceptance.     Plan Discussed with: CRNA  Anesthesia Plan Comments:         Anesthesia Quick Evaluation

## 2015-03-16 ENCOUNTER — Encounter: Payer: Self-pay | Admitting: Surgery

## 2015-03-16 MED ORDER — OXYCODONE-ACETAMINOPHEN 5-325 MG PO TABS: 1.0000 | ORAL_TABLET | Freq: Four times a day (QID) | ORAL | Status: DC | PRN

## 2015-03-16 NOTE — Discharge Summary (Signed)
Patient ID: Samantha Lawson MRN: [id number] DOB/AGE: 1991-04-12 23 y.o.  Admit date: 03/11/2015 Discharge date: 03/16/2015  Discharge Diagnoses:  Biliary pancreatitis  Acute cholecystitis  Procedures Performed: Laparoscopic cholecystectomy ERCP  Discharged Condition: good  Hospital Course: She was admitted with imaging and laboratory values consistent with possible biliary pancreatitis. Her clinical course suggested that diagnosis. Because of the previous workup elevated lipase and bilirubin she underwent an ERCP procedure. ERCP removed multiple stones from her common bile duct. She then develops increasing her pancreatitis initially felt to be related to her biliary tract disease. Her symptoms resolved. She was taken to surgery on July 30 which underwent laparoscopic cholecystectomy. The procedure was uncomplicated. She had no significant postoperative problems. She's up active tolerating a diet with no complaints. Her some today by phone the office in 7-10 days time. Breathing activity and driving instructions were given the patient.  Discharge Orders: Discharge Instructions    Diet - low sodium heart healthy    Complete by:  As directed      Driving Restrictions    Complete by:  As directed   Do not drive while taking pain medicines     Increase activity slowly    Complete by:  As directed      Lifting restrictions    Complete by:  As directed   Do not lift anything greater than the baby     Remove dressing in 24 hours    Complete by:  As directed            Disposition: Final discharge disposition not confirmed  Discharge Medications:  Current facility-administered medications:  .  0.9 %  sodium chloride infusion, , Intravenous, Continuous, Natale LayMark Bird, MD, Last Rate: 100 mL/hr at 03/16/15 0044 .  acetaminophen (TYLENOL) tablet 650 mg, 650 mg, Oral, Q6H PRN, Ida Roguehristopher Lundquist, MD .  Ampicillin-Sulbactam (UNASYN) 3 g in sodium chloride 0.9 % 100 mL IVPB, 3 g, Intravenous,  Q6H, Lattie Hawichard E Cooper, MD, 3 g at 03/16/15 0849 .  diphenhydrAMINE (BENADRYL) injection 25 mg, 25 mg, Intravenous, Q6H PRN, Lorre NickMatthew Cooper, MD, 25 mg at 03/12/15 2217 .  enoxaparin (LOVENOX) injection 40 mg, 40 mg, Subcutaneous, Q24H, Natale LayMark Bird, MD, 40 mg at 03/14/15 1730 .  fentaNYL (SUBLIMAZE) injection 25 mcg, 25 mcg, Intravenous, Q5 min PRN, Yevette EdwardsJames G Adams, MD, 25 mcg at 03/15/15 1647 .  HYDROmorphone (DILAUDID) injection 1 mg, 1 mg, Intravenous, Q2H PRN, Natale LayMark Bird, MD, 1 mg at 03/15/15 1252 .  ondansetron (ZOFRAN) tablet 4 mg, 4 mg, Oral, Q6H PRN **OR** ondansetron (ZOFRAN) injection 4 mg, 4 mg, Intravenous, Q6H PRN, Lattie Hawichard E Cooper, MD, 4 mg at 03/13/15 0043 .  oxyCODONE-acetaminophen (PERCOCET/ROXICET) 5-325 MG per tablet 1-2 tablet, 1-2 tablet, Oral, Q6H PRN, Tiney Rougealph Ely III, MD, 2 tablet at 03/16/15 0013  Follwup:   Signed: Tiney Rougealph Ely III 03/16/2015, 11:28 AM

## 2015-03-16 NOTE — Progress Notes (Signed)
  Subjective: Pt feels much better. Denies abdominal pain  Objective: Vital signs in last 24 hours: Temp:  [97.3 F (36.3 C)-98.3 F (36.8 C)] 98.1 F (36.7 C) (07/06 0756) Pulse Rate:  [49-125] 49 (07/06 0756) Resp:  [15-20] 16 (07/06 0756) BP: (121-145)/(72-93) 129/75 mmHg (07/06 0756) SpO2:  [94 %-100 %] 98 % (07/06 0756) Last BM Date: 03/15/15 Patient's last menstrual period was 02/26/2015. Body mass index is 26.46 kg/(m^2).  General:   Alert,  Well-developed, well-nourished, pleasant and cooperative in NAD Head:  Normocephalic and atraumatic. Eyes:  Sclera clear, no icterus.   Conjunctiva pink. Mouth:  No deformity or lesions, oropharynx pink & moist. Neck:  Supple; no masses or thyromegaly. Heart:  Apical 68.  Regular rate and rhythm; no murmurs, clicks, rubs, or gallops. Abdomen:   Gauze dressing dry & intact.  +faint bowel sounds.  Soft, nontender and nondistended.  Msk:  Symmetrical without gross deformities. Good equal movement & strength bilaterally. Pulses:  Normal pulses noted. Extremities:  Without clubbing or edema.  No cyanosis Neurologic:  Alert and  oriented x3;  grossly normal neurologically. Skin:  Intact without significant lesions or rashes. Psych:  Alert and cooperative. Normal mood and affect.  Intake/Output from previous day: 07/05 0701 - 07/06 0700 In: 3717.6 [P.O.:600; I.V.:2822.6; IV Piggyback:295] Out: 2110 [Urine:2100; Blood:10]  Lab Results:  Recent Labs  03/14/15 0550  WBC 11.3*  HGB 12.7  HCT 38.3  PLT 204   BMET  Recent Labs  03/14/15 0550  NA 137  K 3.4*  CL 106  CO2 23  GLUCOSE 87  BUN 6  CREATININE 0.59  CALCIUM 8.0*   LFT  Recent Labs  03/14/15 0550  PROT 6.0*  ALBUMIN 2.8*  AST 28  ALT 104*  ALKPHOS 75  BILITOT 0.9  LIPASE 184*   Assessment: Post-ERCP pancreatitis s/p stone extraction:  Much improved. S/p cholecystectomy-doing very well.  Plan: Continue supportive measures I will sign off.  Please call  if any further GI concerns or questions.  We would like to thank you for the opportunity to participate in the care of [facility].   LOS: 5 days  Lorenza BurtonKandice Trypp Heckmann  03/16/2015, 10:29 AM [facility]  [address] [place], [place] [address] Phone: [phone] Fax : [phone]

## 2015-03-16 NOTE — Discharge Instructions (Signed)

## 2015-03-16 NOTE — Progress Notes (Signed)
A&O. VSS. Tolerating diet well. No complaints of pain or nausea. Resting comfortably. 4 lap site dressings dry and intact. Discharged per MD orders. Discharge instructions reviewed with pt and pt verbalized understanding. Prescription given to pt. IV removed per policy. Discharged ambulatory.

## 2015-03-17 LAB — SURGICAL PATHOLOGY

## 2015-03-24 ENCOUNTER — Telehealth: Payer: Self-pay | Admitting: Surgery

## 2015-03-24 ENCOUNTER — Ambulatory Visit: Payer: Medicaid Other | Admitting: Surgery

## 2015-03-24 NOTE — Telephone Encounter (Signed)
LVM for pt to resch for PO. She was a NS

## 2015-03-30 ENCOUNTER — Ambulatory Visit (INDEPENDENT_AMBULATORY_CARE_PROVIDER_SITE_OTHER): Payer: Medicaid Other | Admitting: Surgery

## 2015-03-30 ENCOUNTER — Encounter: Payer: Self-pay | Admitting: Surgery

## 2015-03-30 VITALS — BP 132/75 | HR 68 | Temp 98.1°F | Ht 62.0 in | Wt 138.0 lb

## 2015-03-30 DIAGNOSIS — K8050 Calculus of bile duct without cholangitis or cholecystitis without obstruction: Secondary | ICD-10-CM

## 2015-03-30 DIAGNOSIS — K851 Biliary acute pancreatitis without necrosis or infection: Secondary | ICD-10-CM

## 2015-03-30 DIAGNOSIS — K819 Cholecystitis, unspecified: Secondary | ICD-10-CM

## 2015-03-30 NOTE — Patient Instructions (Signed)
Please follow-up if needed.  Call our office with any questions or concerns.

## 2015-03-31 NOTE — Progress Notes (Signed)
Subjective:     Patient ID: Samantha Lawson, female   DOB: Jul 20, 1991, 24 y.o.   MRN: [id number]  HPI 24 year old recently postpartum white female with biliary pancreatitis post-ERCP for extraction of common bile duct stones who underwent laparoscopic cholecystectomy on July 5.  Currently she is doing well eating well with no pain.  Review of Systems    negative. Objective:   Physical Exam    her abdomen is soft and nontender and her incisions have healed nicely sutures were removed today in the office. She is got no signs of jaundice. Assessment:     Overall doing well status post laparoscopic cholecystectomy following difficult ERCP complicated by post-ERCP pancreatitis.    Plan:     Activity and diet as tolerated. We will see her back in the office as needed.  A copy of the pathology report was provided to her and her family.

## 2016-07-26 IMAGING — MR MR MRCP
9 of 12 series · 34 of 48 positions shown · non-contrast
Comparison: 03/11/2015

CLINICAL DATA: Choledocholithiasis. Right upper quadrant abdominal
pain for 2 weeks. The patient is 3 months postpartum.

EXAM:
MRI ABDOMEN WITHOUT CONTRAST  (INCLUDING MRCP)
TECHNIQUE: Multiplanar multisequence MR imaging of the abdomen was performed.
Heavily T2-weighted images of the biliary and pancreatic ducts were
obtained, and three-dimensional MRCP images were rendered by post
processing.

[Series 3: T2 fat-sat · axial · 8.0mm · 0.74mm/px · z∈[-50,+181]mm · 2 of 25 slices shown]
[im 1/25]
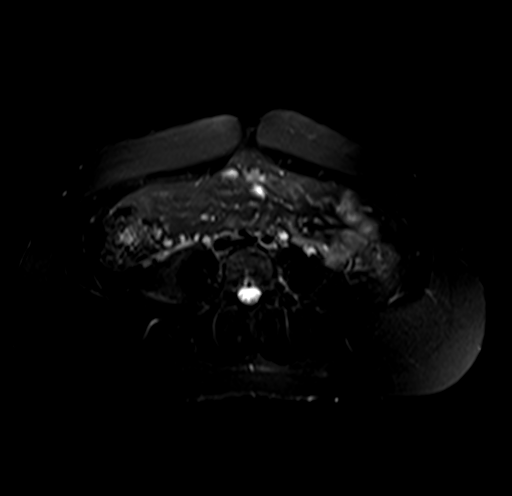
[im 25/25]
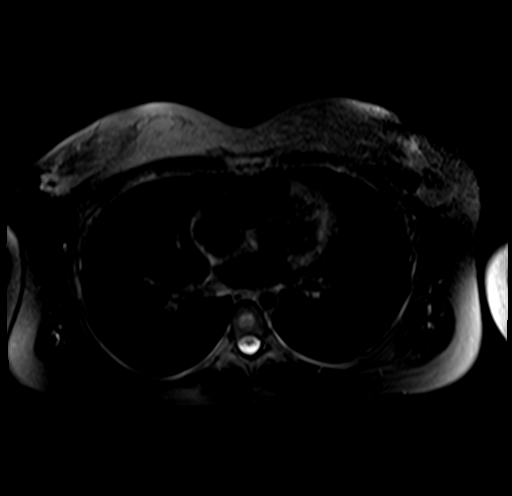

[Series 5: cor tru fisp · coronal · 4.0mm · 0.78mm/px · 4 of 50 slices shown]
[im 1/50]
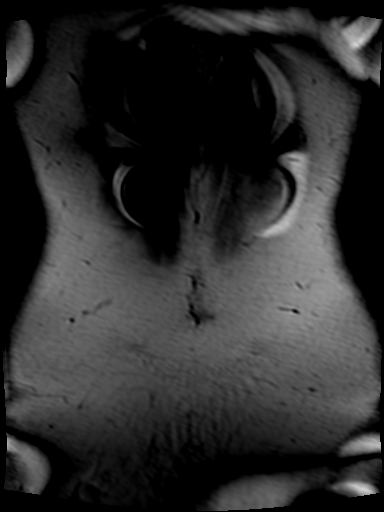
[im 17/50]
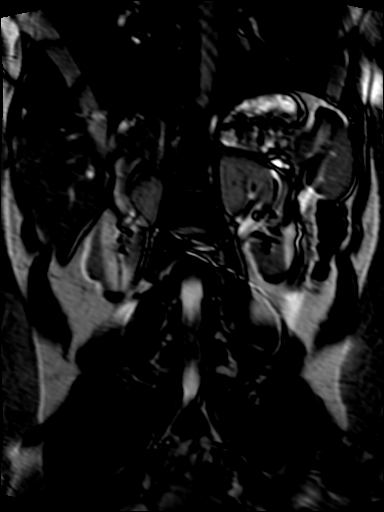
[im 33/50]
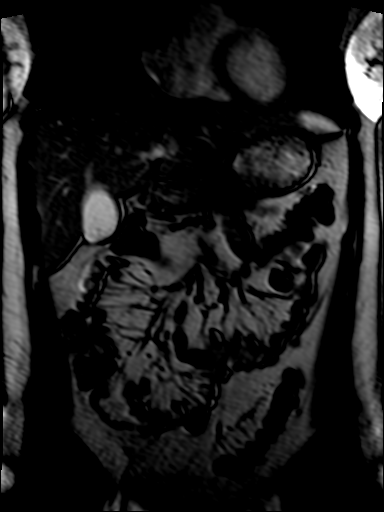
[im 50/50]
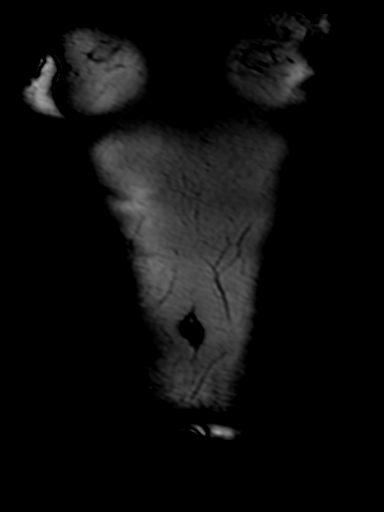

[Series 6: T2 · axial · 8.0mm · 0.74mm/px · z∈[-50,+181]mm · 2 of 25 slices shown]
[im 1/25]
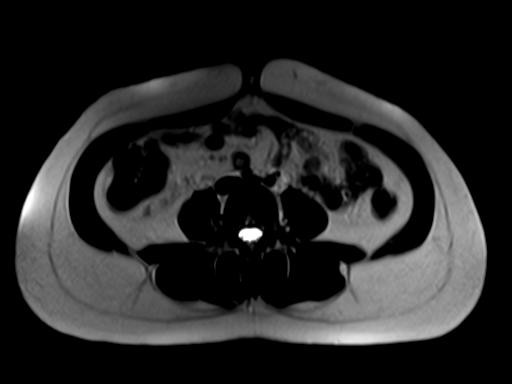
[im 25/25]
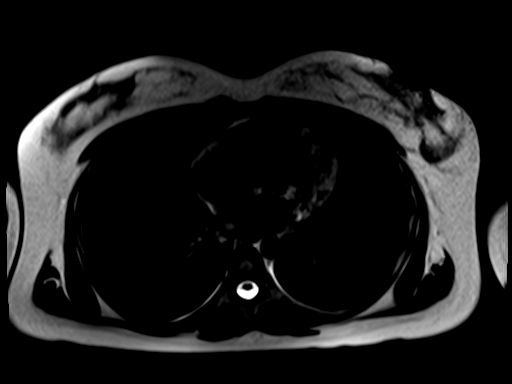

[Series 8: DWI · axial · 6.0mm · 1.98mm/px · z∈[-42,+202]mm · 8 of 105 slices shown]
[im 1/105]
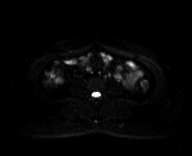
[im 12/105]
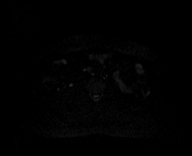
[im 35/105]
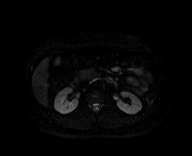
[im 47/105]
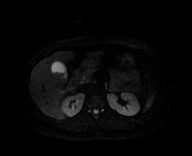
[im 58/105]
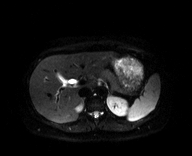
[im 70/105]
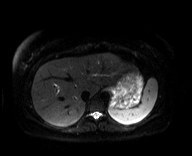
[im 93/105]
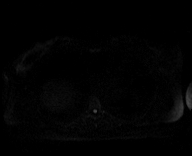
[im 105/105]
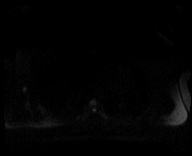

[Series 9: ax dwi_adc · axial · 6.0mm · 1.98mm/px · z∈[-42,+202]mm · 4 of 35 slices shown]
[im 1/35]
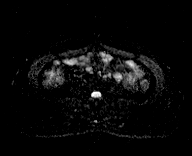
[im 12/35]
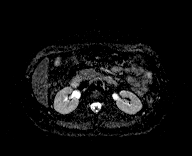
[im 23/35]
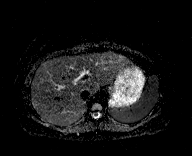
[im 35/35]
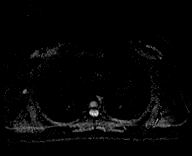

[Series 10: T1 dynamic fat-sat · axial · 2.5mm · 0.74mm/px · z∈[-60,+177]mm · 8 of 96 slices shown]
[im 1/96]
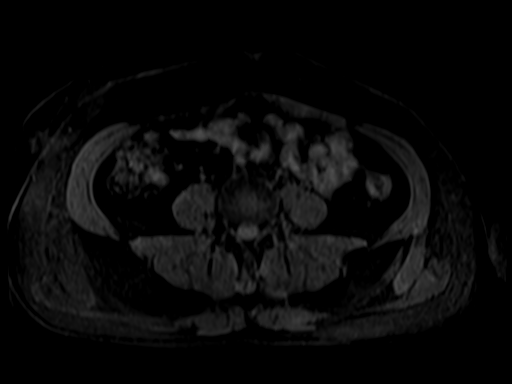
[im 11/96]
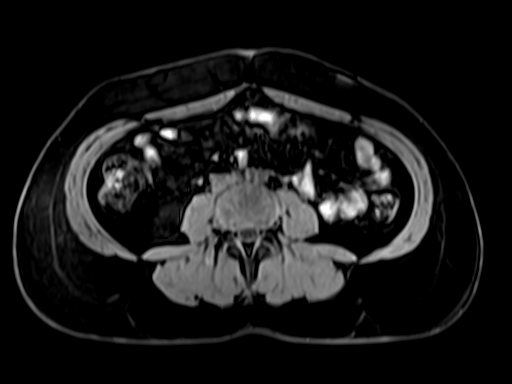
[im 32/96]
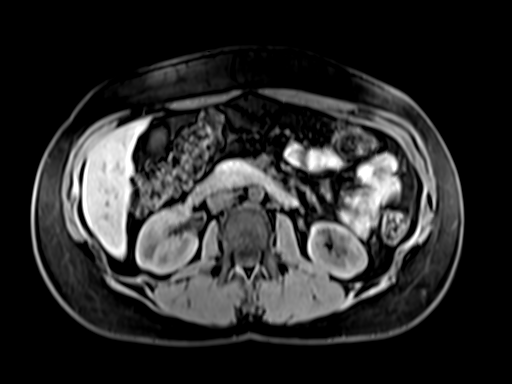
[im 43/96]
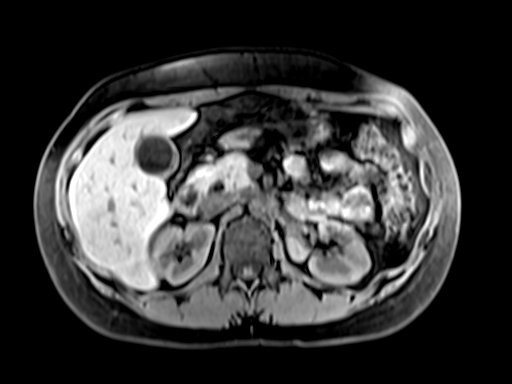
[im 53/96]
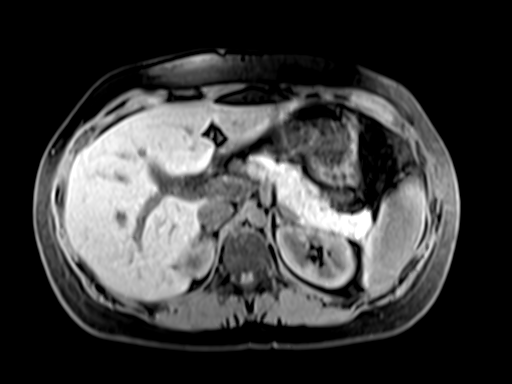
[im 64/96]
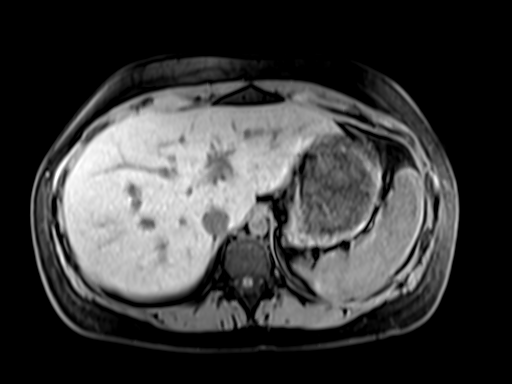
[im 85/96]
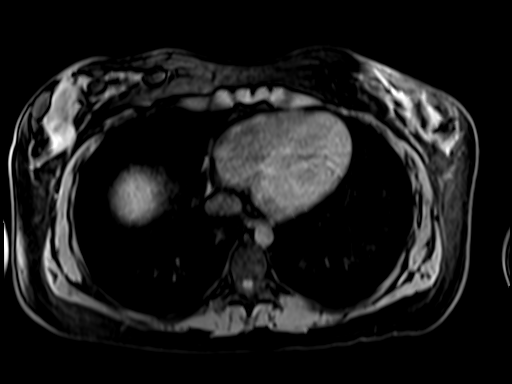
[im 96/96]
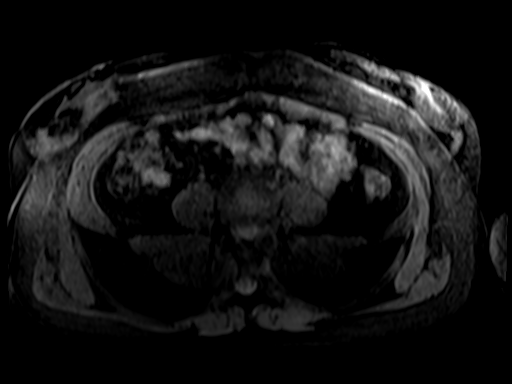

[Series 11: MRCP · coronal · 40.0mm · 0.91mm/px · 1 of 6 slices shown]
[im 1/6]
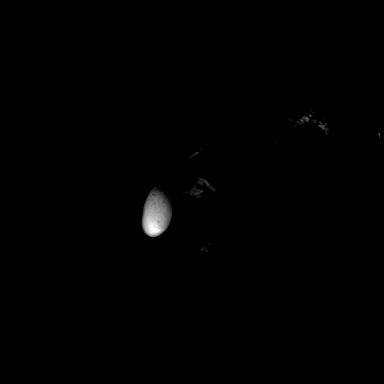

[Series 15: cor thins · coronal · 4.0mm · 0.89mm/px · 2 of 19 slices shown]
[im 1/19]
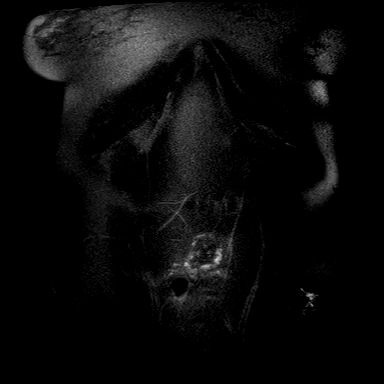
[im 19/19]
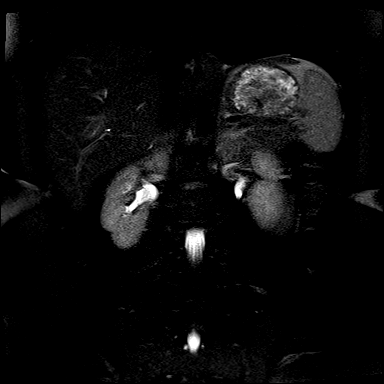

[Series 100: out of phase · axial · 8.0mm · 0.74mm/px · z∈[-50,+181]mm · 3 of 25 slices shown]
[im 1/25]
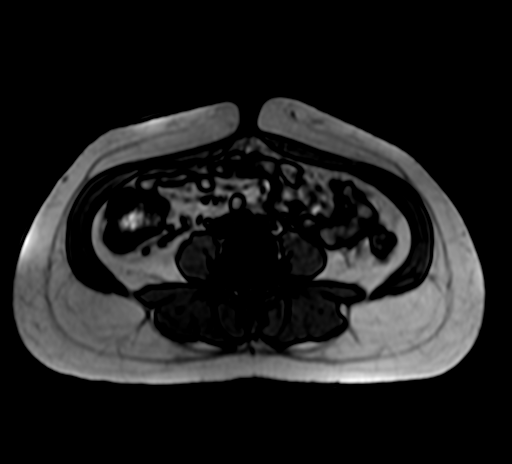
[im 13/25]
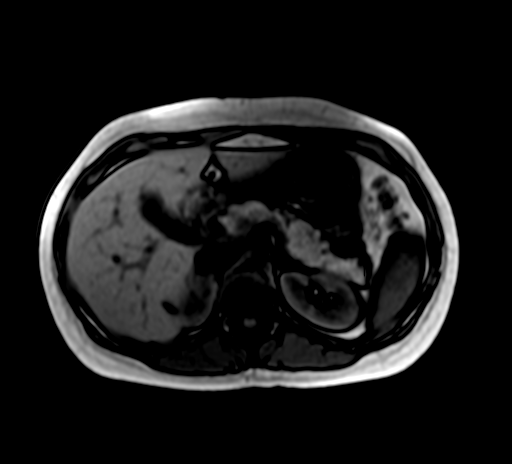
[im 25/25]
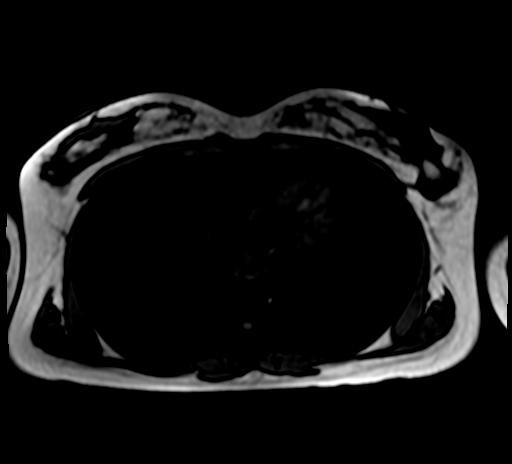

[34 of 48 positions shown; findings below may reference images not displayed]

FINDINGS: Lower chest:  Unremarkable

Hepatobiliary: Numerous gallstones are layering in the gallbladder,
with gallbladder wall thickening and trace pericholecystic fluid.

Mild intrahepatic biliary dilatation. Common hepatic duct 9 mm
diameter, common bile duct 8 mm diameter. There multiple small
filling defects in the distal CBD ranging from about 1 mm to 2.5 mm
in diameter, as shown on images 12 through 14 of series 13 ; image 7
of series 15, compatible with choledocholithiasis.

Pancreas: Unremarkable

Spleen: Unremarkable

Adrenals/Urinary Tract: Unremarkable

Stomach/Bowel: Unremarkable

Vascular/Lymphatic: Unremarkable

Other: No supplemental non-categorized findings.

Musculoskeletal: Unremarkable
IMPRESSION: 1. Numerous layering tiny gallstones in the gallbladder, with
multiple gallstones in the distal common bile duct measuring between
about 1 and 2.5 mm in diameter. There is moderate extrahepatic
biliary dilatation and mild intrahepatic biliary dilatation, and an
impacted stone in the vicinity of the ampulla of is a likely
contributor.
2. Gallbladder wall thickening with trace pericholecystic fluid.
Acute cholecystitis is not excluded.
